# Patient Record
Sex: Female | Born: 1975 | Race: Black or African American | Hispanic: No | Marital: Single | Smoking: Former smoker
Health system: Southern US, Community
[De-identification: ages and names within clinical notes are randomized; demographics above are authoritative.]

## PROBLEM LIST (undated history)

## (undated) ENCOUNTER — Inpatient Hospital Stay (HOSPITAL_COMMUNITY): Payer: Self-pay

## (undated) DIAGNOSIS — N730 Acute parametritis and pelvic cellulitis: Secondary | ICD-10-CM

## (undated) DIAGNOSIS — A549 Gonococcal infection, unspecified: Secondary | ICD-10-CM

## (undated) DIAGNOSIS — F32A Depression, unspecified: Secondary | ICD-10-CM

## (undated) DIAGNOSIS — F329 Major depressive disorder, single episode, unspecified: Secondary | ICD-10-CM

## (undated) HISTORY — PX: BREAST SURGERY: SHX581

## (undated) HISTORY — PX: INDUCED ABORTION: SHX677

---

## 2004-09-20 ENCOUNTER — Emergency Department (HOSPITAL_COMMUNITY): Admission: EM | Admit: 2004-09-20 | Discharge: 2004-09-20 | Payer: Self-pay | Admitting: Emergency Medicine

## 2004-11-21 ENCOUNTER — Emergency Department (HOSPITAL_COMMUNITY): Admission: EM | Admit: 2004-11-21 | Discharge: 2004-11-21 | Payer: Self-pay | Admitting: Emergency Medicine

## 2005-02-19 ENCOUNTER — Inpatient Hospital Stay (HOSPITAL_COMMUNITY): Admission: AD | Admit: 2005-02-19 | Discharge: 2005-02-19 | Payer: Self-pay | Admitting: *Deleted

## 2005-06-10 ENCOUNTER — Inpatient Hospital Stay (HOSPITAL_COMMUNITY): Admission: AD | Admit: 2005-06-10 | Discharge: 2005-06-10 | Payer: Self-pay | Admitting: Obstetrics & Gynecology

## 2005-06-14 ENCOUNTER — Inpatient Hospital Stay (HOSPITAL_COMMUNITY): Admission: AD | Admit: 2005-06-14 | Discharge: 2005-06-14 | Payer: Self-pay | Admitting: *Deleted

## 2005-09-08 ENCOUNTER — Inpatient Hospital Stay (HOSPITAL_COMMUNITY): Admission: AD | Admit: 2005-09-08 | Discharge: 2005-09-08 | Payer: Self-pay | Admitting: Obstetrics

## 2005-11-05 ENCOUNTER — Inpatient Hospital Stay (HOSPITAL_COMMUNITY): Admission: AD | Admit: 2005-11-05 | Discharge: 2005-11-05 | Payer: Self-pay | Admitting: Obstetrics

## 2005-11-26 ENCOUNTER — Inpatient Hospital Stay (HOSPITAL_COMMUNITY): Admission: AD | Admit: 2005-11-26 | Discharge: 2005-11-26 | Payer: Self-pay | Admitting: Obstetrics

## 2005-12-21 ENCOUNTER — Inpatient Hospital Stay (HOSPITAL_COMMUNITY): Admission: AD | Admit: 2005-12-21 | Discharge: 2005-12-21 | Payer: Self-pay | Admitting: Obstetrics

## 2006-01-02 ENCOUNTER — Inpatient Hospital Stay (HOSPITAL_COMMUNITY): Admission: AD | Admit: 2006-01-02 | Discharge: 2006-01-05 | Payer: Self-pay | Admitting: Obstetrics

## 2006-01-26 ENCOUNTER — Inpatient Hospital Stay (HOSPITAL_COMMUNITY): Admission: AD | Admit: 2006-01-26 | Discharge: 2006-01-26 | Payer: Self-pay | Admitting: Obstetrics

## 2006-03-04 ENCOUNTER — Inpatient Hospital Stay (HOSPITAL_COMMUNITY): Admission: AD | Admit: 2006-03-04 | Discharge: 2006-03-04 | Payer: Self-pay | Admitting: Obstetrics

## 2007-03-13 ENCOUNTER — Emergency Department (HOSPITAL_COMMUNITY): Admission: EM | Admit: 2007-03-13 | Discharge: 2007-03-13 | Payer: Self-pay | Admitting: Emergency Medicine

## 2007-06-12 ENCOUNTER — Inpatient Hospital Stay (HOSPITAL_COMMUNITY): Admission: AD | Admit: 2007-06-12 | Discharge: 2007-06-12 | Payer: Self-pay | Admitting: Gynecology

## 2007-11-10 ENCOUNTER — Inpatient Hospital Stay (HOSPITAL_COMMUNITY): Admission: AD | Admit: 2007-11-10 | Discharge: 2007-11-10 | Payer: Self-pay | Admitting: Obstetrics

## 2007-11-24 ENCOUNTER — Inpatient Hospital Stay (HOSPITAL_COMMUNITY): Admission: AD | Admit: 2007-11-24 | Discharge: 2007-11-24 | Payer: Self-pay | Admitting: Obstetrics

## 2007-12-09 ENCOUNTER — Inpatient Hospital Stay (HOSPITAL_COMMUNITY): Admission: AD | Admit: 2007-12-09 | Discharge: 2007-12-13 | Payer: Self-pay | Admitting: Obstetrics

## 2008-01-09 ENCOUNTER — Emergency Department (HOSPITAL_COMMUNITY): Admission: EM | Admit: 2008-01-09 | Discharge: 2008-01-09 | Payer: Self-pay | Admitting: Emergency Medicine

## 2008-04-12 ENCOUNTER — Emergency Department (HOSPITAL_COMMUNITY): Admission: EM | Admit: 2008-04-12 | Discharge: 2008-04-12 | Payer: Self-pay | Admitting: Emergency Medicine

## 2008-04-14 ENCOUNTER — Emergency Department (HOSPITAL_COMMUNITY): Admission: EM | Admit: 2008-04-14 | Discharge: 2008-04-14 | Payer: Self-pay | Admitting: Emergency Medicine

## 2008-04-15 ENCOUNTER — Emergency Department (HOSPITAL_COMMUNITY): Admission: EM | Admit: 2008-04-15 | Discharge: 2008-04-15 | Payer: Self-pay | Admitting: Emergency Medicine

## 2008-05-18 ENCOUNTER — Emergency Department (HOSPITAL_COMMUNITY): Admission: EM | Admit: 2008-05-18 | Discharge: 2008-05-19 | Payer: Self-pay | Admitting: Emergency Medicine

## 2008-05-24 ENCOUNTER — Emergency Department (HOSPITAL_COMMUNITY): Admission: EM | Admit: 2008-05-24 | Discharge: 2008-05-24 | Payer: Self-pay | Admitting: Family Medicine

## 2008-06-29 ENCOUNTER — Emergency Department (HOSPITAL_COMMUNITY): Admission: EM | Admit: 2008-06-29 | Discharge: 2008-06-29 | Payer: Self-pay | Admitting: Emergency Medicine

## 2008-09-05 ENCOUNTER — Emergency Department (HOSPITAL_COMMUNITY): Admission: EM | Admit: 2008-09-05 | Discharge: 2008-09-05 | Payer: Self-pay | Admitting: Emergency Medicine

## 2008-10-10 ENCOUNTER — Emergency Department (HOSPITAL_COMMUNITY): Admission: EM | Admit: 2008-10-10 | Discharge: 2008-10-10 | Payer: Self-pay | Admitting: Emergency Medicine

## 2008-11-28 ENCOUNTER — Emergency Department (HOSPITAL_COMMUNITY): Admission: EM | Admit: 2008-11-28 | Discharge: 2008-11-28 | Payer: Self-pay | Admitting: Emergency Medicine

## 2008-11-29 ENCOUNTER — Emergency Department (HOSPITAL_COMMUNITY): Admission: EM | Admit: 2008-11-29 | Discharge: 2008-11-29 | Payer: Self-pay | Admitting: Emergency Medicine

## 2008-12-16 ENCOUNTER — Encounter: Admission: RE | Admit: 2008-12-16 | Discharge: 2008-12-16 | Payer: Self-pay | Admitting: Obstetrics

## 2009-05-02 ENCOUNTER — Inpatient Hospital Stay (HOSPITAL_COMMUNITY): Admission: AD | Admit: 2009-05-02 | Discharge: 2009-05-02 | Payer: Self-pay | Admitting: Obstetrics & Gynecology

## 2010-08-31 ENCOUNTER — Inpatient Hospital Stay (HOSPITAL_COMMUNITY): Admission: EM | Admit: 2010-08-31 | Discharge: 2010-09-03 | Payer: Self-pay | Admitting: Emergency Medicine

## 2010-12-16 ENCOUNTER — Encounter: Payer: Self-pay | Admitting: Obstetrics

## 2011-02-07 LAB — CBC
MCH: 28.4 pg (ref 26.0–34.0)
MCHC: 33.2 g/dL (ref 30.0–36.0)
Platelets: 362 10*3/uL (ref 150–400)

## 2011-02-07 LAB — POCT I-STAT, CHEM 8
Calcium, Ion: 1.14 mmol/L (ref 1.12–1.32)
Creatinine, Ser: 0.8 mg/dL (ref 0.4–1.2)
Glucose, Bld: 103 mg/dL — ABNORMAL HIGH (ref 70–99)
Hemoglobin: 13.3 g/dL (ref 12.0–15.0)
Sodium: 138 mEq/L (ref 135–145)

## 2011-02-07 LAB — CULTURE, ROUTINE-ABSCESS

## 2011-02-07 LAB — DIFFERENTIAL
Basophils Absolute: 0 10*3/uL (ref 0.0–0.1)
Basophils Relative: 0 % (ref 0–1)
Lymphs Abs: 4.3 10*3/uL — ABNORMAL HIGH (ref 0.7–4.0)
Neutrophils Relative %: 66 % (ref 43–77)

## 2011-02-07 LAB — ANAEROBIC CULTURE

## 2011-03-04 LAB — URINE MICROSCOPIC-ADD ON

## 2011-03-04 LAB — URINALYSIS, ROUTINE W REFLEX MICROSCOPIC
Leukocytes, UA: NEGATIVE
Protein, ur: NEGATIVE mg/dL
Specific Gravity, Urine: >1.03 — ABNORMAL HIGH (ref 1.005–1.030)
Urobilinogen, UA: 1 mg/dL (ref 0.0–1.0)

## 2011-04-09 NOTE — Op Note (Signed)
NAMETIAN, DAVISON                ACCOUNT NO.:  [id number]   MEDICAL RECORD NO.:  [id number]          PATIENT TYPE:  INP   LOCATION:  9135                          FACILITY:  [facility]   PHYSICIAN:  Kathreen Cosier, M.D.DATE OF BIRTH:  1976/07/27   DATE OF PROCEDURE:  12/10/2007  DATE OF DISCHARGE:                               OPERATIVE REPORT   PREOPERATIVE DIAGNOSIS:  Previous cesarean section at term with ruptured  membranes.   POSTOPERATIVE DIAGNOSIS:  Previous cesarean section at term with  ruptured membranes.   SURGEON:  Dr. Gaynell Face   PROCEDURE:  Repeat low transverse cesarean section.   FIRST ASSISTANT:  Dr. Tamela Oddi   ANESTHESIA:  Spinal.   PROCEDURE:  The patient was placed on the operating table in the supine  position after the spinal administered.  Abdomen prepped and draped,  __________  Foley catheter.  Transverse suprapubic incision was made  through the old scar and carried down to the rectus fascia.  The fascia  __________ the length of the incision.  The recti muscle was retracted  laterally.  Peritoneum incised longitudinally.  A transverse incision  made in the visceral peritoneum above the bladder.  Bladder mobilized  inferiorly.  The uterine wall was very thin and fluid was clear.  She  was delivered from the LOA position of a female.  Apgar 9 and 9 weighing 6  pounds 13 ounces.  The team was in attendance.  The placenta was  anterior and removed manually.  Anterior fundal removed manually.  Uterine cavity cleaned with dry laps.  Uterine incision closed in one  layer with continuous suture of #1 chromic.  Hemostasis was  satisfactory.  Bladder flap repaired with with 2-0 chromic.  The uterus  was contracted.  Tubes and ovaries normal.  Abdomen closed in layers.  The peritoneum continuous suture of chromic fascia, continuous suture of  0 Dexon, and the skin closed with subcuticular stitch of 4-0 Monocryl.  Blood loss 600 mL.     ______________________________  Kathreen Cosier, M.D.     BAM/MEDQ  D:  12/10/2007  T:  12/10/2007  Job:  [id number]

## 2011-04-09 NOTE — Discharge Summary (Signed)
NAMECARYLON, [name]                ACCOUNT NO.:  [id number]   MEDICAL RECORD NO.:  [id number]          PATIENT TYPE:  INP   LOCATION:  9135                          [facility]  [facility]   PHYSICIAN:  Kathreen Cosier, M.D.DATE OF BIRTH:  [birth year]   DATE OF ADMISSION:  12/09/2007  DATE OF DISCHARGE:  12/13/2007                               DISCHARGE SUMMARY   HISTORY:  Patient is a 35 year old gravida 4, para 2-0-1-2, [facility] December 21, 2007, had 2 previous  C. sections.  She was admitted with ruptured membranes at term and she  had a repeat low transverse cesarean section.  She had a female, Apgar 9  and 9, weighing 6 pounds 13 ounces.  Postoperatively, she did well.  On  admission, her hemoglobin was 11.6, postop 10.7.  White count 15.3,  11.6, RPR negative, HIV negative.  She was discharged home on the third  postoperative day ambulatory on a regular diet to see me in 6 weeks.   DISCHARGE DIAGNOSIS:  Status post repeat low transverse cesarean section  at term.           ______________________________  Kathreen Cosier, M.D.     BAM/MEDQ  D:  12/30/2007  T:  12/30/2007  Job:  [id number]

## 2011-04-10 ENCOUNTER — Inpatient Hospital Stay (HOSPITAL_COMMUNITY): Payer: Medicaid Other

## 2011-04-10 ENCOUNTER — Encounter (HOSPITAL_COMMUNITY): Payer: Self-pay | Admitting: Radiology

## 2011-04-10 ENCOUNTER — Inpatient Hospital Stay (HOSPITAL_COMMUNITY)
Admission: AD | Admit: 2011-04-10 | Discharge: 2011-04-10 | Disposition: A | Discharge status: Home / Self Care | Payer: Medicaid Other | Source: Ambulatory Visit | Attending: Family Medicine | Admitting: Family Medicine

## 2011-04-10 DIAGNOSIS — M549 Dorsalgia, unspecified: Secondary | ICD-10-CM | POA: Insufficient documentation

## 2011-04-10 DIAGNOSIS — O209 Hemorrhage in early pregnancy, unspecified: Secondary | ICD-10-CM

## 2011-04-10 LAB — CBC
MCHC: 33.4 g/dL (ref 30.0–36.0)
MCV: 84.2 fL (ref 78.0–100.0)
RBC: 4.44 MIL/uL (ref 3.87–5.11)
WBC: 22.6 10*3/uL — ABNORMAL HIGH (ref 4.0–10.5)

## 2011-04-10 LAB — URINALYSIS, ROUTINE W REFLEX MICROSCOPIC
Glucose, UA: NEGATIVE mg/dL
Ketones, ur: 15 mg/dL — AB
Protein, ur: 30 mg/dL — AB
Specific Gravity, Urine: >1.03 — ABNORMAL HIGH (ref 1.005–1.030)

## 2011-04-10 LAB — URINE MICROSCOPIC-ADD ON

## 2011-04-10 LAB — WET PREP, GENITAL: Trich, Wet Prep: NONE SEEN

## 2011-04-11 LAB — URINE CULTURE: Colony Count: >=100000

## 2011-04-11 LAB — GC/CHLAMYDIA PROBE AMP, GENITAL: Chlamydia, DNA Probe: NEGATIVE

## 2011-04-12 ENCOUNTER — Inpatient Hospital Stay (HOSPITAL_COMMUNITY): Payer: Medicaid Other

## 2011-04-12 ENCOUNTER — Inpatient Hospital Stay (HOSPITAL_COMMUNITY)
Admission: AD | Admit: 2011-04-12 | Discharge: 2011-04-12 | Disposition: A | Discharge status: Home / Self Care | Payer: Medicaid Other | Source: Ambulatory Visit | Attending: Family Medicine | Admitting: Family Medicine

## 2011-04-12 DIAGNOSIS — O200 Threatened abortion: Secondary | ICD-10-CM | POA: Insufficient documentation

## 2011-04-12 DIAGNOSIS — R52 Pain, unspecified: Secondary | ICD-10-CM

## 2011-04-12 DIAGNOSIS — R58 Hemorrhage, not elsewhere classified: Secondary | ICD-10-CM

## 2011-04-12 LAB — CBC
HCT: 36.2 % (ref 36.0–46.0)
Hemoglobin: 12 g/dL (ref 12.0–15.0)
MCH: 28 pg (ref 26.0–34.0)
MCHC: 33.1 g/dL (ref 30.0–36.0)

## 2011-04-12 NOTE — Op Note (Signed)
[name], [name]                ACCOUNT NO.:  [id number]   MEDICAL RECORD NO.:  [id number]          PATIENT TYPE:  INP   LOCATION:  9107                          [facility]   PHYSICIAN:  Kathreen Cosier, M.D.DATE OF BIRTH:  1976/01/26   DATE OF PROCEDURE:  01/02/2006  DATE OF DISCHARGE:                                 OPERATIVE REPORT   PREOPERATIVE DIAGNOSIS:  Previous cesarean section at 41 weeks.  The patient  desires repeat cesarean section at this point.   SURGEON:  Kathreen Cosier, M.D.   ANESTHESIA:  Epidural.   DESCRIPTION OF PROCEDURE:  The patient was placed on the operating table in  the supine position.  Abdomen prepped and draped. Bladder emptied with Foley  catheter.  Transverse suprapubic incision made through the old scar,  carried down through the rectus fascia.  The fascia was cleaned and incised  the length of the incision.  Rectus muscle were retracted laterally.  The  peritoneum was incised longitudinally.  Transverse incision made in the  lower aspect of the uterus.  Fluid was clear.  The patient was delivered  from the LOA position of a female, Apgars 8 and 9, weight 6 pounds 13 ounces.  The team was in attendance.  Placenta was fundal  and removed manually.  Uterine cavity was cleaned with dry laps.  Uterine incision closed in one  layer with continuous suture of #1 chromic.  Hemostasis was satisfactory.  Tubes and ovaries normal.  Abdomen closed in layers.  Peritoneum with  continuous suture of 0 chromic, fascia with continuous suture of 0 Dexon and  skin closed with subcuticular stitch of 3-0 Monocryl.  Blood loss 1200 mL.           ______________________________  Kathreen Cosier, M.D.     BAM/MEDQ  D:  01/02/2006  T:  01/03/2006  Job:  [id number]

## 2011-04-12 NOTE — Discharge Summary (Signed)
NAMECORLISS, COGGESHALL                ACCOUNT NO.:  [id number]   MEDICAL RECORD NO.:  [id number]          PATIENT TYPE:  INP   LOCATION:  9107                          [facility]  [facility]   PHYSICIAN:  Kathreen Cosier, M.D.DATE OF BIRTH:  Mar 08, 1976   DATE OF ADMISSION:  01/02/2006  DATE OF DISCHARGE:  01/05/2006                                 DISCHARGE SUMMARY   Patient is a 35 year old gravida 3, para 1-0-1-1, [facility] December 26, 2005.  She  had a previous cesarean section.  The cervix was 1 cm, 80%, vertex, -2/-3.  Membranes ruptured artificially.  Fluid clear.  IUPC inserted and she was  started on low dose Pitocin.  The patient after being in labor all day with  no significant progress demanded a repeat cesarean section which was granted  and she had a repeat low transverse cesarean section.  She had a female, Apgar  8/9 weighing 6 pounds 13 ounces.  On admission hemoglobin 12.8,  postoperative 12.1.  White count 14.9, 14.8.  The patient was noted to have  a left breast cyst which she has had drained twice in the past and this was  treated.  She was treated with antibiotics and warm soaks to the breast.  The abscess started draining foul smelling fluid on the third postoperative  day and patient is to be followed up at the breast center week after  discharge.   DISCHARGE DIAGNOSES:  Status post repeat low transverse cesarean section at  term and draining left breast abscess.           ______________________________  Kathreen Cosier, M.D.     BAM/MEDQ  D:  01/22/2006  T:  01/22/2006  Job:  [id number]

## 2011-04-12 NOTE — H&P (Signed)
[name], [name]                ACCOUNT NO.:  [id number]   MEDICAL RECORD NO.:  [id number]          PATIENT TYPE:  INP   LOCATION:  9107                          [facility]   PHYSICIAN:  Kathreen Cosier, M.D.DATE OF BIRTH:  June 09, 1976   DATE OF ADMISSION:  01/02/2006  DATE OF DISCHARGE:                                HISTORY & PHYSICAL   PREOPERATIVE DIAGNOSIS:  Previous cesarean section at 41 weeks, brought in  for induction and the patient then demanded a C-section.   HISTORY OF PRESENT ILLNESS:  The patient is a 35 year old gravida 3, para 1-  0-1-1, [facility] December 26, 2005.  Previous C-section at term, failure to  progress.  On admission cervix 1 cm, 80% vertex, -3.  Membranes are  ruptured, fluid is clear.  __________started on low dose Pitocin at 7 a.m.  on February 8.  Estimated fetal weight 7 pounds 3 ounces.  By 4:30 p.m. the  cervix was 2 cm, vertex, -3.  She had been in adequate labor all day.  The  patient stated that she did not want the labor anymore.  She wanted a repeat  C-section.  Her wishes were granted.   PHYSICAL EXAMINATION:  GENERAL:  Revealed a well-developed female in labor.  HEENT:  Negative.  LUNGS:  Clear.  HEART:  Regular rhythm.  No murmurs, no gallops.  BREASTS:  No masses.  ABDOMEN:  Term size uterus.  Estimated fetal weight 7 pounds 3 ounces.  PELVIC:  As described above.  EXTREMITIES:  Negative.           ______________________________  Kathreen Cosier, M.D.     BAM/MEDQ  D:  01/02/2006  T:  01/02/2006  Job:  [id number]

## 2011-04-13 LAB — RH IMMUNE GLOBULIN WORKUP (NOT WOMEN'S HOSP): Unit division: 0

## 2011-05-16 ENCOUNTER — Inpatient Hospital Stay (HOSPITAL_COMMUNITY): Admission: AD | Admit: 2011-05-16 | Payer: Self-pay | Source: Ambulatory Visit | Admitting: Obstetrics

## 2011-05-16 ENCOUNTER — Inpatient Hospital Stay (HOSPITAL_COMMUNITY): Payer: Medicaid Other

## 2011-05-16 ENCOUNTER — Inpatient Hospital Stay (HOSPITAL_COMMUNITY)
Admission: AD | Admit: 2011-05-16 | Discharge: 2011-05-16 | Disposition: A | Discharge status: Home / Self Care | Payer: Medicaid Other | Source: Ambulatory Visit | Attending: Obstetrics | Admitting: Obstetrics

## 2011-05-16 DIAGNOSIS — O021 Missed abortion: Secondary | ICD-10-CM | POA: Insufficient documentation

## 2011-05-16 LAB — URINALYSIS, ROUTINE W REFLEX MICROSCOPIC
Bilirubin Urine: NEGATIVE
Ketones, ur: NEGATIVE mg/dL
Nitrite: NEGATIVE
Urobilinogen, UA: 1 mg/dL (ref 0.0–1.0)

## 2011-05-16 LAB — WET PREP, GENITAL
Trich, Wet Prep: NONE SEEN
Yeast Wet Prep HPF POC: NONE SEEN

## 2011-05-17 ENCOUNTER — Inpatient Hospital Stay (HOSPITAL_COMMUNITY)
Admission: EM | Admit: 2011-05-17 | Discharge: 2011-05-18 | DRG: 779 | Disposition: A | Discharge status: Home / Self Care | Payer: Medicaid Other | Source: Ambulatory Visit | Attending: Obstetrics | Admitting: Obstetrics

## 2011-05-17 ENCOUNTER — Encounter (HOSPITAL_COMMUNITY): Payer: Self-pay | Admitting: *Deleted

## 2011-05-17 ENCOUNTER — Other Ambulatory Visit: Payer: Self-pay | Admitting: Obstetrics

## 2011-05-17 ENCOUNTER — Inpatient Hospital Stay (HOSPITAL_COMMUNITY): Admission: EM | Admit: 2011-05-17 | Payer: Medicaid Other | Source: Ambulatory Visit | Admitting: Obstetrics

## 2011-05-17 DIAGNOSIS — O021 Missed abortion: Principal | ICD-10-CM | POA: Diagnosis present

## 2011-05-17 LAB — CBC
Hemoglobin: 12 g/dL (ref 12.0–15.0)
MCH: 29.1 pg (ref 26.0–34.0)
MCHC: 33.9 g/dL (ref 30.0–36.0)
MCV: 85.7 fL (ref 78.0–100.0)
Platelets: 401 10*3/uL — ABNORMAL HIGH (ref 150–400)

## 2011-05-17 LAB — GC/CHLAMYDIA PROBE AMP, GENITAL: GC Probe Amp, Genital: NEGATIVE

## 2011-05-17 LAB — RPR: RPR Ser Ql: NONREACTIVE

## 2011-05-18 LAB — CBC
HCT: 28.3 % — ABNORMAL LOW (ref 36.0–46.0)
RDW: 15 % (ref 11.5–15.5)
WBC: 15.8 10*3/uL — ABNORMAL HIGH (ref 4.0–10.5)

## 2011-05-18 LAB — RH IMMUNE GLOBULIN WORKUP (NOT WOMEN'S HOSP)

## 2011-05-22 NOTE — Discharge Summary (Signed)
  [name], [name]                ACCOUNT NO.:  [id number]  MEDICAL RECORD NO.:  [id number]  LOCATION:  9305                          [facility]  [facility]  PHYSICIAN:  Kathreen Cosier, M.D.DATE OF BIRTH:  [birth year]  DATE OF ADMISSION:  05/17/2011 DATE OF DISCHARGE:                              DISCHARGE SUMMARY   HISTORY OF PRESENT ILLNESS:  The patient is a 35 year old gravida 5, para 3, had three C-sections in the past who came to the hospital was May 16, 2011, with some amount of vaginal bleeding.  Ultrasound showed she had 16-week fetal demise and laminaria was placed in her cervix, since the patient did not want to be admitted at that time and then she was admitted to the hospital on May 17, 2011.  The laminaria was removed and 600 mcg of Cytotec were placed high up in the vagina at 3:50 p.m., she passed a macerated fetus, and this placenta was still intact so 800 mcg of Cytotec was placed per rectum and at 7:15 p.m., the placenta was removed from the vagina.  She did well post delivery. She was known to have a Crawford antigen and received RhoGAM.  She was discharged home on postpartum day #1 on Percocet to 7.5/325 and Loestrin for contraception to see me in one month.  DISCHARGE DIAGNOSES:  Status post second trimester intrauterine fetal demise.          ______________________________ Kathreen Cosier, M.D.     BAM/MEDQ  D:  05/18/2011  T:  05/18/2011  Job:  [id number]  Electronically Signed by Francoise Ceo M.D. on 05/22/2011 09:18:35 AM

## 2011-05-26 DEATH — deceased

## 2011-06-15 ENCOUNTER — Inpatient Hospital Stay (HOSPITAL_COMMUNITY): Admission: AD | Admit: 2011-06-15 | Payer: Self-pay | Source: Ambulatory Visit | Admitting: Obstetrics

## 2011-08-14 LAB — CBC
Hemoglobin: 11.6 — ABNORMAL LOW
MCHC: 35
RBC: 4.13
WBC: 15.3 — ABNORMAL HIGH

## 2011-08-14 LAB — RPR: RPR Ser Ql: NONREACTIVE

## 2011-08-15 LAB — CBC
HCT: 31.6 — ABNORMAL LOW
Hemoglobin: 10.7 — ABNORMAL LOW
MCV: 82.1
Platelets: 249
RBC: 3.85 — ABNORMAL LOW
WBC: 11.6 — ABNORMAL HIGH

## 2011-08-15 LAB — RH IMMUNE GLOB WKUP(>/=20WKS)(NOT WOMEN'S HOSP): Fetal Screen: POSITIVE

## 2011-08-15 LAB — KLEIHAUER-BETKE STAIN: # Vials RhIg: 1

## 2011-08-30 LAB — RH IMMUNE GLOBULIN WORKUP (NOT WOMEN'S HOSP)

## 2011-09-09 LAB — CULTURE, ROUTINE-ABSCESS

## 2011-09-09 LAB — POCT PREGNANCY, URINE: Operator id: 133441

## 2011-10-23 NOTE — H&P (Signed)
  NAMENAWAL, BURLING                ACCOUNT NO.:  [id number]  MEDICAL RECORD NO.:  [id number]  LOCATION:  [facility]                          [facility]ILITY:  [facility]  PHYSICIAN:  Kathreen Cosier, M.D.DATE OF BIRTH:  12/21/1975  DATE OF ADMISSION:  06/15/2011 DATE OF DISCHARGE:                             HISTORY & PHYSICAL   The patient is a 35 year old gravida 5, para 3 patient of mine who was not seen during this pregnancy __________ up in the emergency room complaining of bleeding.  She is supposed to be [redacted] weeks pregnant and ultrasound done showed an intrauterine fetal demise at 16 weeks.  The patient also had laminaria placed in her cervix and was sent home from the emergency room to return on May 22.  At that time, the laminaria was removed and 600 mcg of Cytotec was inserted in the vagina.  The patient was known to have Crawford antigen and received RhoGAM.  At 3:50 p.m. on June 22, she passed the macerated fetus and 800 mcg of Cytotec was placed per rectum and at 7:15 p.m. she passed the placenta spontaneously.  PHYSICAL EXAMINATION:  GENERAL:  A well-developed female, in no distress. HEENT:  Negative. LUNGS:  Clear. HEART:  Regular rhythm.  No murmurs.  No gallops. BREASTS:  No masses. ABDOMEN:  16-18 weeks size. EXTREMITIES:  Negative. PELVIC:  As described above.          ______________________________ Kathreen Cosier, M.D.     BAM/MEDQ  D:  05/29/2011  T:  05/29/2011  Job:  [id number]  Electronically Signed by Francoise Ceo M.D. on 10/23/2011 08:24:06 AM

## 2012-01-09 ENCOUNTER — Encounter (HOSPITAL_COMMUNITY): Payer: Self-pay | Admitting: *Deleted

## 2012-04-15 ENCOUNTER — Emergency Department (HOSPITAL_COMMUNITY)
Admission: EM | Admit: 2012-04-15 | Discharge: 2012-04-15 | Disposition: A | Discharge status: Home / Self Care | Payer: Self-pay | Attending: Emergency Medicine | Admitting: Emergency Medicine

## 2012-04-15 ENCOUNTER — Emergency Department (HOSPITAL_COMMUNITY): Payer: Self-pay

## 2012-04-15 ENCOUNTER — Encounter (HOSPITAL_COMMUNITY): Payer: Self-pay | Admitting: *Deleted

## 2012-04-15 DIAGNOSIS — R059 Cough, unspecified: Secondary | ICD-10-CM | POA: Insufficient documentation

## 2012-04-15 DIAGNOSIS — J069 Acute upper respiratory infection, unspecified: Secondary | ICD-10-CM | POA: Insufficient documentation

## 2012-04-15 DIAGNOSIS — R5383 Other fatigue: Secondary | ICD-10-CM | POA: Insufficient documentation

## 2012-04-15 DIAGNOSIS — M549 Dorsalgia, unspecified: Secondary | ICD-10-CM | POA: Insufficient documentation

## 2012-04-15 DIAGNOSIS — R079 Chest pain, unspecified: Secondary | ICD-10-CM | POA: Insufficient documentation

## 2012-04-15 DIAGNOSIS — R05 Cough: Secondary | ICD-10-CM | POA: Insufficient documentation

## 2012-04-15 DIAGNOSIS — R0989 Other specified symptoms and signs involving the circulatory and respiratory systems: Secondary | ICD-10-CM | POA: Insufficient documentation

## 2012-04-15 DIAGNOSIS — R5381 Other malaise: Secondary | ICD-10-CM | POA: Insufficient documentation

## 2012-04-15 DIAGNOSIS — R6883 Chills (without fever): Secondary | ICD-10-CM | POA: Insufficient documentation

## 2012-04-15 DIAGNOSIS — R1110 Vomiting, unspecified: Secondary | ICD-10-CM | POA: Insufficient documentation

## 2012-04-15 MED ORDER — AZITHROMYCIN 250 MG PO TABS: 250.0000 mg | ORAL_TABLET | Freq: Every day | ORAL | Status: AC

## 2012-04-15 MED ORDER — HYDROCOD POLST-CHLORPHEN POLST 10-8 MG/5ML PO LQCR
5.0000 mL | Freq: Once | ORAL | Status: AC
  Administered 2012-04-15: 5 mL via ORAL
  Filled 2012-04-15: qty 5

## 2012-04-15 MED ORDER — HYDROCOD POLST-CHLORPHEN POLST 10-8 MG/5ML PO LQCR: 5.0000 mL | Freq: Two times a day (BID) | ORAL | Status: DC | PRN

## 2012-04-15 MED ORDER — SODIUM CHLORIDE 0.9 % IV BOLUS (SEPSIS)
1000.0000 mL | Freq: Once | INTRAVENOUS | Status: AC
  Administered 2012-04-15: 1000 mL via INTRAVENOUS

## 2012-04-15 NOTE — ED Notes (Signed)
Patient reports she has been sick for a month.  She has a cough, back pain, n/v, and coughing up green colored phlegm.  She denies fever at present.  She states she is feeling weak and had to have someone drive her here today

## 2012-04-15 NOTE — ED Notes (Signed)
Family at bedside. 

## 2012-04-15 NOTE — ED Provider Notes (Signed)
History     CSN: [id number]  Arrival date & time 04/15/12  1028   First MD Initiated Contact with Patient 04/15/12 1127      Chief Complaint  Patient presents with  . Cough  . Emesis  . Back Pain    (Consider location/radiation/quality/duration/timing/severity/associated sxs/prior treatment) Patient is a 36 y.o. female presenting with cough. The history is provided by the patient.  Cough This is a new problem. The current episode started more than 1 week ago. The problem occurs constantly. The problem has been gradually worsening. The cough is productive of sputum. There has been no fever. Associated symptoms include chest pain and chills. Pertinent negatives include no wheezing. Associated symptoms comments: Symptoms of chest congestion, cough, post-tussive vomiting, chills and now generalized weakness x one month. She denies sinus or nasal congestion, nausea, abdominal pain or diarrhea.. She is a smoker.    History reviewed. No pertinent past medical history.  Past Surgical History  Procedure Date  . Breast surgery   . Induced abortion     No family history on file.  History  Substance Use Topics  . Smoking status: Current Everyday Smoker  . Smokeless tobacco: Not on file  . Alcohol Use: Yes    OB History    Grav Para Term Preterm Abortions TAB SAB Ect Mult Living   1               Review of Systems  Constitutional: Positive for chills.  HENT: Negative for congestion.   Respiratory: Positive for cough. Negative for wheezing.   Cardiovascular: Positive for chest pain.  Gastrointestinal: Positive for vomiting. Negative for nausea and abdominal pain.       Vomiting is post-tussive only.  Musculoskeletal: Positive for back pain.  Skin: Negative for rash.  Neurological: Positive for weakness.    Allergies  Review of patient's allergies indicates no known allergies.  Home Medications   Current Outpatient Rx  Name Route Sig Dispense Refill  . IBUPROFEN 200  MG PO TABS Oral Take 200 mg by mouth every 6 (six) hours as needed. For pain      BP 109/62  Pulse 83  Temp(Src) 98.1 F (36.7 C) (Oral)  Resp 18  Ht 5\' 5"  (1.651 m)  Wt 201 lb 5 oz (91.315 kg)  BMI 33.50 kg/m2  SpO2 97%  LMP 04/15/2012  Physical Exam  Constitutional: She is oriented to person, place, and time. She appears well-developed and well-nourished.  HENT:  Head: Normocephalic.  Eyes: Pupils are equal, round, and reactive to light.  Neck: Normal range of motion. Neck supple.  Cardiovascular: Normal rate and regular rhythm.   No murmur heard. Pulmonary/Chest: Effort normal and breath sounds normal. She has no wheezes. She has no rales.  Abdominal: Soft. Bowel sounds are normal. There is no tenderness. There is no rebound and no guarding.  Musculoskeletal: Normal range of motion. She exhibits no edema.  Neurological: She is alert and oriented to person, place, and time.  Skin: Skin is warm and dry. No rash noted.  Psychiatric: She has a normal mood and affect.    ED Course  Procedures (including critical care time)  Labs Reviewed - No data to display Dg Chest 2 View  04/15/2012  *RADIOLOGY REPORT*  Clinical Data: Shortness of breath, cough and upper back pain.  CHEST - 2 VIEW  Comparison: None.  Findings: Trachea is midline.  Heart size normal.  Lungs are clear. No pleural fluid.  IMPRESSION: Negative.  Original  Report Authenticated By: Reyes Ivan, M.D.     No diagnosis found. 1. URI   MDM  Patient stable for discharge. Exam c/w URI and patient nontoxic in appearance.        Rodena Medin, PA-C 04/19/12 1033

## 2012-04-15 NOTE — ED Notes (Signed)
Patient is resting comfortably. 

## 2012-04-15 NOTE — Discharge Instructions (Signed)
YOU CAN BE DISCHARGED HOME. TAKE MEDICATIONS AS PRESCRIBED. PUSH FLUIDS. REST. RECOMMEND TYLENOL AND/OR IBUPROFEN FOR ANY ACHES AND IF ANY FEVER DEVELOPS. FOLLOW UP WITH YOUR DOCTOR FOR RECHECK IN 1-2 DAYS.  Upper Respiratory Infection, Adult An upper respiratory infection (URI) is also known as the common cold. It is often caused by a type of germ (virus). Colds are easily spread (contagious). You can pass it to others by kissing, coughing, sneezing, or drinking out of the same glass. Usually, you get better in 1 or 2 weeks.  HOME CARE   Only take medicine as told by your doctor.   Use a warm mist humidifier or breathe in steam from a hot shower.   Drink enough water and fluids to keep your pee (urine) clear or pale yellow.   Get plenty of rest.   Return to work when your temperature is back to normal or as told by your doctor. You may use a face mask and wash your hands to stop your cold from spreading.  GET HELP RIGHT AWAY IF:   After the first few days, you feel you are getting worse.   You have questions about your medicine.   You have chills, shortness of breath, or brown or red spit (mucus).   You have yellow or brown snot (nasal discharge) or pain in the face, especially when you bend forward.   You have a fever, puffy (swollen) neck, pain when you swallow, or white spots in the back of your throat.   You have a bad headache, ear pain, sinus pain, or chest pain.   You have a high-pitched whistling sound when you breathe in and out (wheezing).   You have a lasting cough or cough up blood.   You have sore muscles or a stiff neck.  MAKE SURE YOU:   Understand these instructions.   Will watch your condition.   Will get help right away if you are not doing well or get worse.  Document Released: 04/29/2008 Document Revised: 10/31/2011 Document Reviewed: 03/18/2011 [facility] Patient Information 2012 [place], [place].

## 2012-04-20 NOTE — ED Provider Notes (Signed)
Medical screening examination/treatment/procedure(s) were performed by non-physician practitioner and as supervising physician I was immediately available for consultation/collaboration.   Gavin Pound. Lanis Storlie, MD 04/20/12 1610

## 2012-10-24 ENCOUNTER — Emergency Department (HOSPITAL_COMMUNITY)
Admission: EM | Admit: 2012-10-24 | Discharge: 2012-10-24 | Disposition: A | Discharge status: Home / Self Care | Payer: Self-pay | Attending: Emergency Medicine | Admitting: Emergency Medicine

## 2012-10-24 ENCOUNTER — Encounter (HOSPITAL_COMMUNITY): Payer: Self-pay | Admitting: Emergency Medicine

## 2012-10-24 DIAGNOSIS — J3489 Other specified disorders of nose and nasal sinuses: Secondary | ICD-10-CM | POA: Insufficient documentation

## 2012-10-24 DIAGNOSIS — J069 Acute upper respiratory infection, unspecified: Secondary | ICD-10-CM | POA: Insufficient documentation

## 2012-10-24 DIAGNOSIS — R51 Headache: Secondary | ICD-10-CM | POA: Insufficient documentation

## 2012-10-24 DIAGNOSIS — R52 Pain, unspecified: Secondary | ICD-10-CM | POA: Insufficient documentation

## 2012-10-24 DIAGNOSIS — R079 Chest pain, unspecified: Secondary | ICD-10-CM | POA: Insufficient documentation

## 2012-10-24 DIAGNOSIS — F17200 Nicotine dependence, unspecified, uncomplicated: Secondary | ICD-10-CM | POA: Insufficient documentation

## 2012-10-24 DIAGNOSIS — B349 Viral infection, unspecified: Secondary | ICD-10-CM

## 2012-10-24 DIAGNOSIS — B9789 Other viral agents as the cause of diseases classified elsewhere: Secondary | ICD-10-CM | POA: Insufficient documentation

## 2012-10-24 LAB — RAPID STREP SCREEN (MED CTR MEBANE ONLY): Streptococcus, Group A Screen (Direct): NEGATIVE

## 2012-10-24 MED ORDER — IBUPROFEN 400 MG PO TABS
800.0000 mg | ORAL_TABLET | Freq: Once | ORAL | Status: AC
  Administered 2012-10-24: 800 mg via ORAL
  Filled 2012-10-24 (×2): qty 2

## 2012-10-24 MED ORDER — ALBUTEROL SULFATE HFA 108 (90 BASE) MCG/ACT IN AERS
2.0000 | INHALATION_SPRAY | RESPIRATORY_TRACT | Status: DC | PRN
  Administered 2012-10-24: 2 via RESPIRATORY_TRACT
  Filled 2012-10-24 (×2): qty 6.7

## 2012-10-24 NOTE — ED Provider Notes (Signed)
History   This chart was scribed for Elizabeth Chick, MD scribed by Magnus Sinning. The patient was seen in room TR05C/TR05C at 12:09    CSN: [id number]  Arrival date & time 10/24/12  1129   None     Chief Complaint  Patient presents with  . Sore Throat  . Nasal Congestion  . Generalized Body Aches    (Consider location/radiation/quality/duration/timing/severity/associated sxs/prior treatment) HPI Comments: [name] is a 36 y.o. female who presents to the Emergency Department complaining of constant moderate ST with associated generalized body aches, chest pain, cough, HA, and chills. The patient states her sxs began 3 days ago. She explains that she has been maintaining fluids, but sxs have not resolved.  PCP: None.  Patient is a 36 y.o. female presenting with pharyngitis. The history is provided by the patient and the spouse. No language interpreter was used.  Sore Throat This is a new problem. The current episode started more than 2 days ago. The problem occurs constantly. The problem has not changed since onset.Associated symptoms include chest pain and headaches. Nothing aggravates the symptoms. Nothing relieves the symptoms. She has tried water for the symptoms. The treatment provided no relief.    History reviewed. No pertinent past medical history.  Past Surgical History  Procedure Date  . Breast surgery   . Induced abortion     History reviewed. No pertinent family history.  History  Substance Use Topics  . Smoking status: Current Every Day Smoker  . Smokeless tobacco: Not on file  . Alcohol Use: Yes    OB History    Grav Para Term Preterm Abortions TAB SAB Ect Mult Living   1               Review of Systems  Cardiovascular: Positive for chest pain.  Neurological: Positive for headaches.  All other systems reviewed and are negative.    Allergies  Review of patient's allergies indicates no known allergies.  Home Medications   No current  outpatient prescriptions on file.  BP 124/74  Pulse 81  Temp 98.1 F (36.7 C)  Resp 16  SpO2 100%  Physical Exam  Nursing note and vitals reviewed. Constitutional: She is oriented to person, place, and time. She appears well-developed and well-nourished. No distress.  HENT:  Head: Normocephalic and atraumatic.       Oropharynx has moderate erythema. Pallete is symmetric and uvual is midline  Eyes: Conjunctivae normal and EOM are normal.  Neck: Neck supple. No tracheal deviation present.  Cardiovascular: Normal rate and regular rhythm.   Pulmonary/Chest: Effort normal. No respiratory distress. She has no wheezes. She has no rales.       Lungs clear  Abdominal: She exhibits no distension.  Musculoskeletal: Normal range of motion.  Neurological: She is alert and oriented to person, place, and time. No sensory deficit.  Skin: Skin is warm and dry. No rash noted.  Psychiatric: She has a normal mood and affect. Her behavior is normal.    ED Course  Procedures (including critical care time) DIAGNOSTIC STUDIES: Oxygen Saturation is 100% on room air, normal by my interpretation.    COORDINATION OF CARE:   Labs Reviewed  RAPID STREP SCREEN   No results found.   1. Viral infection   2. URI (upper respiratory infection)       MDM  Pt with subjective fever, sore throat, headache, bodyaches, hoarse voice and cough.  Rapid strep negative.  Lungs clear, vitals reassuring.  Doubt pneumonia.  More likely viral infection with some component of bronchitis. Pt given ibuprofen for body aches, etc.  Albuterol MDI for cough.  Discharged with strict return precautions.  Pt agreeable with plan.  I personally performed the services described in this documentation, which was scribed in my presence. The recorded information has been reviewed and is accurate.        Elizabeth Chick, MD 10/24/12 [phone]

## 2012-10-24 NOTE — ED Notes (Signed)
Pt c/o sore throat and fever with generalized body aches x 2 days; pt c/o sore throat and head congestion

## 2013-07-24 ENCOUNTER — Inpatient Hospital Stay (HOSPITAL_COMMUNITY)
Admission: AD | Admit: 2013-07-24 | Discharge: 2013-07-24 | Disposition: A | Discharge status: Home / Self Care | Payer: Self-pay | Source: Ambulatory Visit | Attending: Obstetrics and Gynecology | Admitting: Obstetrics and Gynecology

## 2013-07-24 ENCOUNTER — Encounter (HOSPITAL_COMMUNITY): Payer: Self-pay | Admitting: *Deleted

## 2013-07-24 DIAGNOSIS — M549 Dorsalgia, unspecified: Secondary | ICD-10-CM | POA: Insufficient documentation

## 2013-07-24 DIAGNOSIS — O210 Mild hyperemesis gravidarum: Secondary | ICD-10-CM

## 2013-07-24 DIAGNOSIS — O211 Hyperemesis gravidarum with metabolic disturbance: Secondary | ICD-10-CM | POA: Insufficient documentation

## 2013-07-24 DIAGNOSIS — O26899 Other specified pregnancy related conditions, unspecified trimester: Secondary | ICD-10-CM

## 2013-07-24 DIAGNOSIS — E860 Dehydration: Secondary | ICD-10-CM | POA: Insufficient documentation

## 2013-07-24 DIAGNOSIS — R109 Unspecified abdominal pain: Secondary | ICD-10-CM | POA: Insufficient documentation

## 2013-07-24 HISTORY — DX: Acute parametritis and pelvic cellulitis: N73.0

## 2013-07-24 HISTORY — DX: Depression, unspecified: F32.A

## 2013-07-24 HISTORY — DX: Major depressive disorder, single episode, unspecified: F32.9

## 2013-07-24 HISTORY — DX: Gonococcal infection, unspecified: A54.9

## 2013-07-24 LAB — URINALYSIS, ROUTINE W REFLEX MICROSCOPIC
Bilirubin Urine: NEGATIVE
Glucose, UA: NEGATIVE mg/dL
Ketones, ur: 15 mg/dL — AB
pH: 6 (ref 5.0–8.0)

## 2013-07-24 LAB — URINE MICROSCOPIC-ADD ON

## 2013-07-24 MED ORDER — PROMETHAZINE HCL 25 MG PO TABS: 25.0000 mg | ORAL_TABLET | Freq: Four times a day (QID) | ORAL | Status: DC | PRN

## 2013-07-24 MED ORDER — ACETAMINOPHEN 325 MG PO TABS
650.0000 mg | ORAL_TABLET | Freq: Once | ORAL | Status: AC
  Administered 2013-07-24: 650 mg via ORAL
  Filled 2013-07-24: qty 2

## 2013-07-24 MED ORDER — ONDANSETRON 8 MG PO TBDP
8.0000 mg | ORAL_TABLET | Freq: Once | ORAL | Status: AC
  Administered 2013-07-24: 8 mg via ORAL
  Filled 2013-07-24: qty 1

## 2013-07-24 NOTE — MAU Provider Note (Signed)
History     CSN: [id number]  Arrival date and time: 07/24/13 1014   First Provider Initiated Contact with Patient 07/24/13 1206      Chief Complaint  Patient presents with  . Abdominal Cramping  . Back Pain   HPI [name] 37 y.o. [redacted]w[redacted]d   Had a positive pregnancy test at home.  Has not started prenatal care.  Is having lower abdominal pain and back pain today.  Has not taken any pain medication.  Reports feeling weak, lack of appetite, nausea and occasional vomiting.  OB History   Grav Para Term Preterm Abortions TAB SAB Ect Mult Living   7 3 3  3 2 1   3       Past Medical History  Diagnosis Date  . Depression   . PID (acute pelvic inflammatory disease)   . Gonorrhea     Past Surgical History  Procedure Laterality Date  . Breast surgery    . Induced abortion    . Cesarean section      X 3    History reviewed. No pertinent family history.  History  Substance Use Topics  . Smoking status: Current Every Day Smoker -- 0.25 packs/day    Types: Cigarettes  . Smokeless tobacco: Not on file  . Alcohol Use: No    Allergies: No Known Allergies  No prescriptions prior to admission    Review of Systems  Constitutional: Negative for fever.  Gastrointestinal: Positive for nausea, vomiting and abdominal pain. Negative for diarrhea and constipation.  Genitourinary:       No vaginal discharge. No vaginal bleeding. No dysuria.   Physical Exam   Blood pressure 123/68, pulse 97, temperature 98.1 F (36.7 C), temperature source Oral, resp. rate 18, last menstrual period 03/24/2013.  Physical Exam  Nursing note and vitals reviewed. Constitutional: She is oriented to person, place, and time. She appears well-developed and well-nourished.  HENT:  Head: Normocephalic.  Eyes: EOM are normal.  Neck: Neck supple.  GI: Soft. There is tenderness. There is no rebound and no guarding.  Genitourinary:  Speculum exam: Vagina - Small amount of creamy discharge, no  odor Cervix - No contact bleeding Bimanual exam: Cervix closed Uterus non tender, 14 week size Adnexa non tender, no masses bilaterally GC/Chlam, wet prep done Chaperone present for exam.  Musculoskeletal: Normal range of motion.  Neurological: She is alert and oriented to person, place, and time.  Skin: Skin is warm and dry.  Psychiatric: She has a normal mood and affect.    MAU Course  Procedures  MDM Results for orders placed during the hospital encounter of 07/24/13 (from the past 24 hour(s))  URINALYSIS, ROUTINE W REFLEX MICROSCOPIC     Status: Abnormal   Collection Time    07/24/13 11:23 AM      Result Value Range   Color, Urine AMBER (*) YELLOW   APPearance HAZY (*) CLEAR   Specific Gravity, Urine >1.030 (*) 1.005 - 1.030   pH 6.0  5.0 - 8.0   Glucose, UA NEGATIVE  NEGATIVE mg/dL   Hgb urine dipstick NEGATIVE  NEGATIVE   Bilirubin Urine NEGATIVE  NEGATIVE   Ketones, ur 15 (*) NEGATIVE mg/dL   Protein, ur 272 (*) NEGATIVE mg/dL   Urobilinogen, UA 2.0 (*) 0.0 - 1.0 mg/dL   Nitrite NEGATIVE  NEGATIVE   Leukocytes, UA NEGATIVE  NEGATIVE  URINE MICROSCOPIC-ADD ON     Status: Abnormal   Collection Time    07/24/13 11:23 AM  Result Value Range   Squamous Epithelial / LPF MANY (*) RARE   WBC, UA 0-2  <3 WBC/hpf   RBC / HPF 0-2  <3 RBC/hpf   Bacteria, UA MANY (*) RARE   Urine-Other MUCOUS PRESENT    POCT PREGNANCY, URINE     Status: Abnormal   Collection Time    07/24/13 11:26 AM      Result Value Range   Preg Test, Ur POSITIVE (*) NEGATIVE  WET PREP, GENITAL     Status: Abnormal   Collection Time    07/24/13 12:12 PM      Result Value Range   Yeast Wet Prep HPF POC NONE SEEN  NONE SEEN   Trich, Wet Prep NONE SEEN  NONE SEEN   Clue Cells Wet Prep HPF POC MODERATE (*) NONE SEEN   WBC, Wet Prep HPF POC FEW (*) NONE SEEN   Given Tylenol and Zofran PO in MAU.  Encouraged to drink lots of fluids and discussed importance of protein at meals.  Encouraged to eat  every 3-4 hours even if no appetite. Client was feeling much better at time of discharge.  Assessment and Plan  Pregnancy 17 weeks by LMP Morning sickness and mild dehydration  Plan Pregnancy verification given No smoking, no drugs, no alcohol.  Take a prenatal vitamin one by mouth every day.  Eat small frequent snacks to avoid nausea.  Begin prenatal care as soon as possible. Plans to see Dr. Gaynell Face. Rx for Phenergan given  [name] 07/24/2013, 12:31 PM

## 2013-07-24 NOTE — MAU Provider Note (Signed)
Attestation of Attending Supervision of Advanced Practitioner (CNM/NP): Evaluation and management procedures were performed by the Advanced Practitioner under my supervision and collaboration.  I have reviewed the Advanced Practitioner's note and chart, and I agree with the management and plan.  Rossana Molchan 07/24/2013 4:34 PM   

## 2013-07-24 NOTE — MAU Note (Signed)
Abdominal cramping and back pain for over a month. Feels sharp pains in lower abdomen down into vaginal area. States urine keeps getting darker and darker. + UPT about 2 months ago.

## 2013-07-24 NOTE — MAU Note (Signed)
Patient not in lobby when called

## 2013-07-26 LAB — GC/CHLAMYDIA PROBE AMP: CT Probe RNA: NEGATIVE

## 2013-09-07 ENCOUNTER — Other Ambulatory Visit (HOSPITAL_COMMUNITY): Payer: Self-pay | Admitting: Obstetrics

## 2013-09-07 DIAGNOSIS — Z3689 Encounter for other specified antenatal screening: Secondary | ICD-10-CM

## 2013-09-07 DIAGNOSIS — O09522 Supervision of elderly multigravida, second trimester: Secondary | ICD-10-CM

## 2013-09-16 ENCOUNTER — Ambulatory Visit (HOSPITAL_COMMUNITY)
Admission: RE | Admit: 2013-09-16 | Discharge: 2013-09-16 | Disposition: A | Discharge status: Home / Self Care | Payer: Medicaid Other | Source: Ambulatory Visit | Attending: Obstetrics | Admitting: Obstetrics

## 2013-09-16 DIAGNOSIS — Z1389 Encounter for screening for other disorder: Secondary | ICD-10-CM | POA: Insufficient documentation

## 2013-09-16 DIAGNOSIS — Z363 Encounter for antenatal screening for malformations: Secondary | ICD-10-CM | POA: Insufficient documentation

## 2013-09-16 DIAGNOSIS — O09529 Supervision of elderly multigravida, unspecified trimester: Secondary | ICD-10-CM | POA: Insufficient documentation

## 2013-09-16 DIAGNOSIS — O09299 Supervision of pregnancy with other poor reproductive or obstetric history, unspecified trimester: Secondary | ICD-10-CM | POA: Insufficient documentation

## 2013-09-16 DIAGNOSIS — Z3689 Encounter for other specified antenatal screening: Secondary | ICD-10-CM

## 2013-09-16 DIAGNOSIS — O09522 Supervision of elderly multigravida, second trimester: Secondary | ICD-10-CM

## 2013-09-16 DIAGNOSIS — O358XX0 Maternal care for other (suspected) fetal abnormality and damage, not applicable or unspecified: Secondary | ICD-10-CM | POA: Insufficient documentation

## 2013-09-16 NOTE — Progress Notes (Signed)
Genetic Counseling  High-Risk Gestation Note  Appointment Date:  09/16/2013 Referred By: Kathreen Cosier, MD Date of Birth:  [birth year]    Pregnancy History: [id number] Estimated Date of Delivery: 12/29/13 Estimated Gestational Age: [redacted]w[redacted]d Attending: Particia Nearing, MD   [name] was seen for genetic counseling because of a maternal age of 37 y.o..     She was counseled regarding maternal age and the association with risk for chromosome conditions due to nondisjunction with aging of the ova.   We reviewed chromosomes, nondisjunction, and the associated 1 in 141 risk for fetal aneuploidy related to a maternal age of 37 y.o. at [redacted]w[redacted]d gestation.  She was/They were counseled that the risk for aneuploidy decreases as gestational age increases, accounting for those pregnancies which spontaneously abort.  We specifically discussed Down syndrome (trisomy 81), trisomies 42 and 22, and sex chromosome aneuploidies (47,XXX and 47,XXY) including the common features and prognoses of each.   We reviewed available screening options including noninvasive prenatal screening (NIPS)/cell free fetal DNA (cffDNA) testing, and detailed ultrasound.  She was counseled that screening tests are used to modify a patient's a priori risk for aneuploidy, typically based on age. This estimate provides a pregnancy specific risk assessment. We reviewed the benefits and limitations of each option. Specifically, we discussed the conditions for which each test screens, the detection rates, and false positive rates of each. She was/They were also counseled regarding diagnostic testing via amniocentesis. We reviewed the approximate 1 in 300-500 risk for complications for amniocentesis, including spontaneous preterm labor and delivery. After consideration of all the options, she declined NIPS and amniocentesis. She elected to proceed with ultrasound only.   A detailed ultrasound was performed today. The ultrasound report will be  sent under separate cover. There were no visualized fetal anomalies or markers suggestive of aneuploidy. Diagnostic testing was declined today.  She understands that screening tests cannot rule out all birth defects or genetic syndromes. The patient was advised of this limitation and states she still does not want additional testing at this time.   [name] was provided with written information regarding sickle cell anemia (SCA) including the carrier frequency and incidence in the African-American population, the availability of carrier testing and prenatal diagnosis if indicated.  In addition, we discussed that hemoglobinopathies are routinely screened for as part of the [place] newborn screening panel.  She declined hemoglobin electrophoresis today.  Both family histories were reviewed and found to be noncontributory for birth defects, intellectual disability, and known genetic conditions. Without further information regarding the provided family history, an accurate genetic risk cannot be calculated. Further genetic counseling is warranted if more information is obtained.  [name] denied exposure to environmental toxins or chemical agents. She denied the use of alcohol or street drugs. She reported smoking cigarettes occasionally during the pregnancy. The associations of smoking in pregnancy were reviewed and cessation encouraged. She denied significant viral illnesses during the course of her pregnancy. Her medical and surgical histories were noncontributory.   I counseled [name] regarding the above risks and available options.  The approximate face-to-face time with the genetic counselor was 40 minutes.  Quinn Plowman, MS,  Certified Genetic Counselor 09/16/2013

## 2013-09-21 LAB — OB RESULTS CONSOLE RPR: RPR: NONREACTIVE

## 2013-09-21 LAB — OB RESULTS CONSOLE HEPATITIS B SURFACE ANTIGEN: HEP B S AG: NEGATIVE

## 2013-09-21 LAB — OB RESULTS CONSOLE RUBELLA ANTIBODY, IGM: Rubella: IMMUNE

## 2013-09-21 LAB — OB RESULTS CONSOLE HIV ANTIBODY (ROUTINE TESTING): HIV: NONREACTIVE

## 2013-11-23 ENCOUNTER — Inpatient Hospital Stay (HOSPITAL_COMMUNITY)
Admission: AD | Admit: 2013-11-23 | Discharge: 2013-11-23 | Disposition: A | Discharge status: Home / Self Care | Payer: Medicaid Other | Source: Ambulatory Visit | Attending: Obstetrics | Admitting: Obstetrics

## 2013-11-23 ENCOUNTER — Encounter (HOSPITAL_COMMUNITY): Payer: Self-pay | Admitting: *Deleted

## 2013-11-23 DIAGNOSIS — R109 Unspecified abdominal pain: Secondary | ICD-10-CM | POA: Insufficient documentation

## 2013-11-23 DIAGNOSIS — N949 Unspecified condition associated with female genital organs and menstrual cycle: Secondary | ICD-10-CM | POA: Insufficient documentation

## 2013-11-23 DIAGNOSIS — E860 Dehydration: Secondary | ICD-10-CM | POA: Insufficient documentation

## 2013-11-23 DIAGNOSIS — O99891 Other specified diseases and conditions complicating pregnancy: Secondary | ICD-10-CM | POA: Insufficient documentation

## 2013-11-23 LAB — URINALYSIS, ROUTINE W REFLEX MICROSCOPIC
Glucose, UA: NEGATIVE mg/dL
Hgb urine dipstick: NEGATIVE
Specific Gravity, Urine: >1.03 — ABNORMAL HIGH (ref 1.005–1.030)

## 2013-11-23 LAB — URINE MICROSCOPIC-ADD ON

## 2013-11-23 MED ORDER — CYCLOBENZAPRINE HCL 10 MG PO TABS
10.0000 mg | ORAL_TABLET | Freq: Once | ORAL | Status: AC
  Administered 2013-11-23: 10 mg via ORAL
  Filled 2013-11-23: qty 1

## 2013-11-23 MED ORDER — CYCLOBENZAPRINE HCL 10 MG PO TABS: 10.0000 mg | ORAL_TABLET | Freq: Three times a day (TID) | ORAL | Status: DC | PRN

## 2013-11-23 MED ORDER — LACTATED RINGERS IV BOLUS (SEPSIS): 1000.0000 mL | Freq: Once | INTRAVENOUS | Status: DC

## 2013-11-23 NOTE — MAU Note (Signed)
Pt states she started having yesterday when she walked to the store. Pt complaining of pressure.

## 2013-11-23 NOTE — Progress Notes (Signed)
Pt self positioning sitting up in bed

## 2013-11-23 NOTE — MAU Note (Signed)
PT SAYS SHE  STARTED HAVING UC AND PRESSURE  ON Monday-  .  SAW DR MARSHALL- 3 WEEKS AGO .  NEXT APPOINTMENT ON 1-8.    IS A REPEAT C/S.    LAST SEX-  THIS  AM-   PAIN TOO BAD - HAD TO STOP .

## 2013-11-23 NOTE — Progress Notes (Signed)
Pt states she is having pressure.Pt states she has pain when she gets up and tries to walk.

## 2013-11-23 NOTE — MAU Provider Note (Signed)
History     CSN: [id number]  Arrival date and time: 11/23/13 2003   First Provider Initiated Contact with Patient 11/23/13 2110      No chief complaint on file.  HPI [name] is a 37 y.o. [phone] at [redacted]w[redacted]d who presents to MAU today with complaint of pelvic pain since yesterday. The patient states that the pain is worse with ambulation and changes of position. She denies vaginal bleeding, discharge, LOF, fever or UTI symptoms. She has had some nausea without vomiting or constipation. She has also had occasional loose stools recently. She reports good fetal movement.   OB History   Grav Para Term Preterm Abortions TAB SAB Ect Mult Living   7 3 3  3 2 1   3       Past Medical History  Diagnosis Date  . Depression   . PID (acute pelvic inflammatory disease)   . Gonorrhea     Past Surgical History  Procedure Laterality Date  . Breast surgery    . Induced abortion    . Cesarean section      X 3    Family History  Problem Relation Age of Onset  . Alcohol abuse Neg Hx   . Arthritis Neg Hx   . Asthma Neg Hx   . Birth defects Neg Hx   . Cancer Neg Hx   . COPD Neg Hx   . Depression Neg Hx   . Diabetes Neg Hx   . Drug abuse Neg Hx   . Early death Neg Hx   . Hearing loss Neg Hx   . Heart disease Neg Hx   . Hyperlipidemia Neg Hx   . Hypertension Neg Hx   . Kidney disease Neg Hx   . Learning disabilities Neg Hx   . Mental illness Neg Hx   . Mental retardation Neg Hx   . Miscarriages / Stillbirths Neg Hx   . Stroke Neg Hx   . Vision loss Neg Hx   . Varicose Veins Neg Hx     History  Substance Use Topics  . Smoking status: Current Every Day Smoker -- 0.25 packs/day    Types: Cigarettes  . Smokeless tobacco: Not on file  . Alcohol Use: No    Allergies: No Known Allergies  No prescriptions prior to admission    Review of Systems  Constitutional: Negative for fever and malaise/fatigue.  Gastrointestinal: Positive for nausea, abdominal pain and  diarrhea. Negative for vomiting and constipation.  Genitourinary: Negative for dysuria, urgency and frequency.   Physical Exam   Blood pressure 101/61, pulse 99, temperature 98.3 F (36.8 C), temperature source Oral, resp. rate 20, height 5\' 5"  (1.651 m), weight 197 lb 2 oz (89.415 kg), last menstrual period 03/24/2013.  Physical Exam  Constitutional: She is oriented to person, place, and time. She appears well-developed and well-nourished. No distress.  HENT:  Head: Normocephalic and atraumatic.  Cardiovascular: Normal rate, regular rhythm and normal heart sounds.   Respiratory: Effort normal and breath sounds normal. No respiratory distress.  GI: Soft. Bowel sounds are normal. She exhibits no distension and no mass. There is tenderness (mild suprapubic tenderness to palpation ). There is no rebound and no guarding.  Neurological: She is alert and oriented to person, place, and time.  Skin: Skin is warm and dry. No erythema.  Psychiatric: She has a normal mood and affect.  Dilation: Closed Effacement (%): Thick Cervical Position: Posterior Exam by:: B.Bethea RN  Results for orders placed  during the hospital encounter of 11/23/13 (from the past 24 hour(s))  URINALYSIS, ROUTINE W REFLEX MICROSCOPIC     Status: Abnormal   Collection Time    11/23/13  8:38 PM      Result Value Range   Color, Urine YELLOW  YELLOW   APPearance CLEAR  CLEAR   Specific Gravity, Urine >1.030 (*) 1.005 - 1.030   pH 6.0  5.0 - 8.0   Glucose, UA NEGATIVE  NEGATIVE mg/dL   Hgb urine dipstick NEGATIVE  NEGATIVE   Bilirubin Urine NEGATIVE  NEGATIVE   Ketones, ur >80 (*) NEGATIVE mg/dL   Protein, ur 30 (*) NEGATIVE mg/dL   Urobilinogen, UA 1.0  0.0 - 1.0 mg/dL   Nitrite NEGATIVE  NEGATIVE   Leukocytes, UA NEGATIVE  NEGATIVE  URINE MICROSCOPIC-ADD ON     Status: Abnormal   Collection Time    11/23/13  8:38 PM      Result Value Range   Squamous Epithelial / LPF RARE  RARE   WBC, UA 0-2  <3 WBC/hpf   RBC  / HPF 0-2  <3 RBC/hpf   Bacteria, UA FEW (*) RARE    Fetal Monitoring: Baseline: 145 bpm, moderate variability, + accelerations, no decelerations Contractions: none MAU Course  Procedures None  MDM UA today Cervix is closed. Reactive NST without contractions.  Flexeril given in MAU today UA shows signs of dehydration. Patient denies nausea. She would like to PO hydrate.   Assessment and Plan  A: Round ligament pain Dehydration  P: Discharge home Rx for Flexeril sent to patient's pharmacy Patient advised to increase PO hydration as tolerated Advised patient to use abdominal binder and warm baths PRN pain Patient encouraged to keep scheduled appointment for routine prenatal care with Dr. Gaynell Face Patient may return to MAU as needed or if her condition were to change or worsen  Freddi Starr, PA-C  11/23/2013, 10:27 PM

## 2013-12-02 ENCOUNTER — Other Ambulatory Visit: Payer: Self-pay | Admitting: Obstetrics

## 2013-12-16 ENCOUNTER — Encounter (HOSPITAL_COMMUNITY): Payer: Self-pay | Admitting: Pharmacist

## 2013-12-29 ENCOUNTER — Encounter (HOSPITAL_COMMUNITY): Payer: Self-pay

## 2013-12-29 ENCOUNTER — Encounter (HOSPITAL_COMMUNITY)
Admission: RE | Admit: 2013-12-29 | Discharge: 2013-12-29 | Disposition: A | Discharge status: Home / Self Care | Payer: Medicaid Other | Source: Ambulatory Visit | Attending: Obstetrics | Admitting: Obstetrics

## 2013-12-29 LAB — TYPE AND SCREEN
ABO/RH(D): AB NEG
Antibody Screen: NEGATIVE

## 2013-12-29 LAB — CBC
HCT: 35.6 % — ABNORMAL LOW (ref 36.0–46.0)
Hemoglobin: 12.1 g/dL (ref 12.0–15.0)
MCH: 27.7 pg (ref 26.0–34.0)
MCHC: 34 g/dL (ref 30.0–36.0)
MCV: 81.5 fL (ref 78.0–100.0)
PLATELETS: 278 10*3/uL (ref 150–400)
RBC: 4.37 MIL/uL (ref 3.87–5.11)
RDW: 14.7 % (ref 11.5–15.5)
WBC: 12 10*3/uL — AB (ref 4.0–10.5)

## 2013-12-29 LAB — RPR: RPR Ser Ql: NONREACTIVE

## 2013-12-29 NOTE — H&P (Signed)
[place]:  Kaufman, [name]                ACCOUNT NO.:  [id number]  MEDICAL RECORD NO.:  [id number]  LOCATION:                                 FACILITY:  PHYSICIAN:  Kathreen CosierBernard A. Blakley Michna, M.D.DATE OF BIRTH:  1976/05/04  DATE OF ADMISSION: DATE OF DISCHARGE:                             HISTORY & PHYSICAL   DATE OF OPERATION:  December 30, 2013  HISTORY OF PRESENT ILLNESS:  The patient is a 38 year old, gravida 6, para 3-0-2-3, due February 4 and she has had 2 previous C-sections.  She is in for repeat C-section at this time and tubal ligation.  Her GBS was negative and she is AB positive.  PAST MEDICAL HISTORY:  Sometimes her blood type shows up as Rh negative and sometimes as Rh positive, but she is really positive.  She is a mosaic.  PAST SURGICAL HISTORY:  Three previous C-sections and she is now at term.  SYSTEM REVIEW:  Negative.  PHYSICAL EXAM:  GENERAL:  Well-developed female, in no distress. HEENT:  Negative. LUNGS:  Clear to P and A. HEART:  Regular rhythm.  No murmurs, no gallops. BREASTS:  Negative. ABDOMEN:  Term. PELVIC:  Deferred. EXTREMITIES:  Negative.          ______________________________ Kathreen CosierBernard A. Sol Englert, M.D.     BAM/MEDQ  D:  12/28/2013  T:  12/29/2013  Job:  [id number]

## 2013-12-29 NOTE — Patient Instructions (Signed)
20 [name]  12/29/2013   Your procedure is scheduled on:  12/30/13  [facility] at 6 AM.  Pick up the phone at the desk and dial [phone].   Call this number if you have problems the morning of surgery: [phone]   Remember:   Do not eat food:After Midnight.  Do not drink clear liquids: After Midnight.  Take these medicines the morning of surgery with A SIP OF WATER: NA   Do not wear jewelry, make-up or nail polish.  Do not wear lotions, powders, or perfumes. You may wear deodorant.  Do not shave 48 hours prior to surgery.  Do not bring valuables to the hospital.  [facility] is not   responsible for any belongings or valuables brought to the hospital.  Contacts, dentures or bridgework may not be worn into surgery.  Leave suitcase in the car. After surgery it may be brought to your room.  For patients admitted to the hospital, checkout time is 11:00 AM the day of              discharge.   Patients discharged the day of surgery will not be allowed to drive             home.  Name and phone number of your driver: NA  Special Instructions:   Shower using CHG 2 nights before surgery and the night before surgery.  If you shower the day of surgery use CHG.  Use special wash - you have one bottle of CHG for all showers.  You should use approximately 1/3 of the bottle for each shower.   Please read over the following fact sheets that you were given:   Surgical Site Infection Prevention

## 2013-12-29 NOTE — Anesthesia Preprocedure Evaluation (Addendum)
Anesthesia Evaluation  Patient identified by MRN, date of birth, ID band Patient awake    Reviewed: Allergy & Precautions, H&P , NPO status , Patient's Chart, lab work & pertinent test results  Airway Mallampati: II TM Distance: >3 FB Neck ROM: Full    Dental  (+) Dental Advisory Given and Teeth Intact   Pulmonary former smoker,  breath sounds clear to auscultation        Cardiovascular negative cardio ROS  Rhythm:Regular Rate:Normal     Neuro/Psych PSYCHIATRIC DISORDERS Depression negative neurological ROS     GI/Hepatic negative GI ROS, Neg liver ROS,   Endo/Other  negative endocrine ROS  Renal/GU negative Renal ROS     Musculoskeletal negative musculoskeletal ROS (+)   Abdominal   Peds  Hematology negative hematology ROS (+)   Anesthesia Other Findings   Reproductive/Obstetrics negative OB ROS                          Anesthesia Physical Anesthesia Plan  ASA: II  Anesthesia Plan: Spinal   Post-op Pain Management:    Induction:   Airway Management Planned:   Additional Equipment:   Intra-op Plan:   Post-operative Plan:   Informed Consent: I have reviewed the patients History and Physical, chart, labs and discussed the procedure including the risks, benefits and alternatives for the proposed anesthesia with the patient or authorized representative who has indicated his/her understanding and acceptance.   Dental advisory given  Plan Discussed with: CRNA  Anesthesia Plan Comments:         Anesthesia Quick Evaluation

## 2013-12-30 ENCOUNTER — Encounter (HOSPITAL_COMMUNITY): Payer: Medicaid Other | Admitting: Anesthesiology

## 2013-12-30 ENCOUNTER — Encounter (HOSPITAL_COMMUNITY): Payer: Self-pay | Admitting: Anesthesiology

## 2013-12-30 ENCOUNTER — Inpatient Hospital Stay (HOSPITAL_COMMUNITY): Payer: Medicaid Other | Admitting: Anesthesiology

## 2013-12-30 ENCOUNTER — Inpatient Hospital Stay (HOSPITAL_COMMUNITY)
Admission: RE | Admit: 2013-12-30 | Discharge: 2014-01-01 | DRG: 766 | Disposition: A | Discharge status: Home / Self Care | Payer: Medicaid Other | Source: Ambulatory Visit | Attending: Obstetrics | Admitting: Obstetrics

## 2013-12-30 ENCOUNTER — Encounter (HOSPITAL_COMMUNITY)
Admission: RE | Disposition: A | Discharge status: Home / Self Care | Payer: Self-pay | Source: Ambulatory Visit | Attending: Obstetrics

## 2013-12-30 DIAGNOSIS — O360990 Maternal care for other rhesus isoimmunization, unspecified trimester, not applicable or unspecified: Secondary | ICD-10-CM | POA: Diagnosis present

## 2013-12-30 DIAGNOSIS — O99344 Other mental disorders complicating childbirth: Secondary | ICD-10-CM | POA: Diagnosis present

## 2013-12-30 DIAGNOSIS — F3289 Other specified depressive episodes: Secondary | ICD-10-CM | POA: Diagnosis present

## 2013-12-30 DIAGNOSIS — Z87891 Personal history of nicotine dependence: Secondary | ICD-10-CM

## 2013-12-30 DIAGNOSIS — Z302 Encounter for sterilization: Secondary | ICD-10-CM

## 2013-12-30 DIAGNOSIS — F329 Major depressive disorder, single episode, unspecified: Secondary | ICD-10-CM | POA: Diagnosis present

## 2013-12-30 DIAGNOSIS — O09529 Supervision of elderly multigravida, unspecified trimester: Secondary | ICD-10-CM | POA: Diagnosis present

## 2013-12-30 DIAGNOSIS — Z98891 History of uterine scar from previous surgery: Secondary | ICD-10-CM

## 2013-12-30 DIAGNOSIS — O0940 Supervision of pregnancy with grand multiparity, unspecified trimester: Secondary | ICD-10-CM

## 2013-12-30 DIAGNOSIS — O34219 Maternal care for unspecified type scar from previous cesarean delivery: Principal | ICD-10-CM | POA: Diagnosis present

## 2013-12-30 SURGERY — Surgical Case
Anesthesia: Spinal | Site: Abdomen | Laterality: Bilateral

## 2013-12-30 MED ORDER — PHENYLEPHRINE HCL 10 MG/ML IJ SOLN
INTRAMUSCULAR | Status: DC | PRN
  Administered 2013-12-30: 40 ug via INTRAVENOUS
  Administered 2013-12-30 (×2): 80 ug via INTRAVENOUS

## 2013-12-30 MED ORDER — KETOROLAC TROMETHAMINE 60 MG/2ML IM SOLN
60.0000 mg | Freq: Once | INTRAMUSCULAR | Status: AC | PRN
  Administered 2013-12-30: 60 mg via INTRAMUSCULAR

## 2013-12-30 MED ORDER — LACTATED RINGERS IV SOLN
Freq: Once | INTRAVENOUS | Status: AC
  Administered 2013-12-30: 06:00:00 via INTRAVENOUS

## 2013-12-30 MED ORDER — FENTANYL CITRATE 0.05 MG/ML IJ SOLN
INTRAMUSCULAR | Status: AC
Start: 2013-12-30 — End: 2013-12-30
  Filled 2013-12-30: qty 2

## 2013-12-30 MED ORDER — OXYCODONE-ACETAMINOPHEN 5-325 MG PO TABS
1.0000 | ORAL_TABLET | ORAL | Status: DC | PRN
  Administered 2013-12-30 – 2013-12-31 (×2): 1 via ORAL
  Administered 2013-12-31 – 2014-01-01 (×6): 2 via ORAL
  Filled 2013-12-30 (×3): qty 2
  Filled 2013-12-30 (×2): qty 1
  Filled 2013-12-30 (×3): qty 2

## 2013-12-30 MED ORDER — LACTATED RINGERS IV SOLN
INTRAVENOUS | Status: DC | PRN
  Administered 2013-12-30: 08:00:00 via INTRAVENOUS

## 2013-12-30 MED ORDER — PHENYLEPHRINE 8 MG IN D5W 100 ML (0.08MG/ML) PREMIX OPTIME
INJECTION | INTRAVENOUS | Status: AC
  Filled 2013-12-30: qty 100

## 2013-12-30 MED ORDER — MEPERIDINE HCL 25 MG/ML IJ SOLN: 6.2500 mg | INTRAMUSCULAR | Status: DC | PRN

## 2013-12-30 MED ORDER — NALOXONE HCL 0.4 MG/ML IJ SOLN: 0.4000 mg | INTRAMUSCULAR | Status: DC | PRN

## 2013-12-30 MED ORDER — DIBUCAINE 1 % RE OINT: 1.0000 "application " | TOPICAL_OINTMENT | RECTAL | Status: DC | PRN

## 2013-12-30 MED ORDER — ZOLPIDEM TARTRATE 5 MG PO TABS: 5.0000 mg | ORAL_TABLET | Freq: Every evening | ORAL | Status: DC | PRN

## 2013-12-30 MED ORDER — PHENYLEPHRINE 8 MG IN D5W 100 ML (0.08MG/ML) PREMIX OPTIME
INJECTION | INTRAVENOUS | Status: DC | PRN
  Administered 2013-12-30: 60 ug/min via INTRAVENOUS

## 2013-12-30 MED ORDER — LACTATED RINGERS IV SOLN
INTRAVENOUS | Status: DC
  Administered 2013-12-30 (×3): via INTRAVENOUS

## 2013-12-30 MED ORDER — SENNOSIDES-DOCUSATE SODIUM 8.6-50 MG PO TABS
2.0000 | ORAL_TABLET | ORAL | Status: DC
  Administered 2013-12-30 – 2013-12-31 (×2): 2 via ORAL
  Filled 2013-12-30 (×2): qty 2

## 2013-12-30 MED ORDER — SIMETHICONE 80 MG PO CHEW
80.0000 mg | CHEWABLE_TABLET | ORAL | Status: DC | PRN
  Administered 2014-01-01: 80 mg via ORAL
  Filled 2013-12-30: qty 1

## 2013-12-30 MED ORDER — LACTATED RINGERS IV SOLN
INTRAVENOUS | Status: DC
  Administered 2013-12-30: 17:00:00 via INTRAVENOUS

## 2013-12-30 MED ORDER — METOCLOPRAMIDE HCL 5 MG/ML IJ SOLN: 10.0000 mg | Freq: Once | INTRAMUSCULAR | Status: DC | PRN

## 2013-12-30 MED ORDER — PHENYLEPHRINE 40 MCG/ML (10ML) SYRINGE FOR IV PUSH (FOR BLOOD PRESSURE SUPPORT)
PREFILLED_SYRINGE | INTRAVENOUS | Status: AC
  Filled 2013-12-30: qty 5

## 2013-12-30 MED ORDER — OXYTOCIN 10 UNIT/ML IJ SOLN
INTRAMUSCULAR | Status: AC
  Filled 2013-12-30: qty 4

## 2013-12-30 MED ORDER — SCOPOLAMINE 1 MG/3DAYS TD PT72: 1.0000 | MEDICATED_PATCH | Freq: Once | TRANSDERMAL | Status: DC

## 2013-12-30 MED ORDER — PRENATAL MULTIVITAMIN CH
1.0000 | ORAL_TABLET | Freq: Every day | ORAL | Status: DC
  Administered 2013-12-31 – 2014-01-01 (×2): 1 via ORAL
  Filled 2013-12-30 (×2): qty 1

## 2013-12-30 MED ORDER — DIPHENHYDRAMINE HCL 50 MG/ML IJ SOLN
25.0000 mg | INTRAMUSCULAR | Status: DC | PRN
  Administered 2013-12-30: 25 mg via INTRAMUSCULAR
  Filled 2013-12-30: qty 1

## 2013-12-30 MED ORDER — SIMETHICONE 80 MG PO CHEW
80.0000 mg | CHEWABLE_TABLET | ORAL | Status: DC
  Administered 2013-12-30 – 2013-12-31 (×2): 80 mg via ORAL
  Filled 2013-12-30 (×2): qty 1

## 2013-12-30 MED ORDER — ONDANSETRON HCL 4 MG/2ML IJ SOLN: 4.0000 mg | INTRAMUSCULAR | Status: DC | PRN

## 2013-12-30 MED ORDER — ONDANSETRON HCL 4 MG/2ML IJ SOLN
INTRAMUSCULAR | Status: DC | PRN
  Administered 2013-12-30: 4 mg via INTRAVENOUS

## 2013-12-30 MED ORDER — CEFAZOLIN SODIUM-DEXTROSE 2-3 GM-% IV SOLR
2.0000 g | Freq: Once | INTRAVENOUS | Status: AC
  Administered 2013-12-30: 2 g via INTRAVENOUS

## 2013-12-30 MED ORDER — OXYTOCIN 40 UNITS IN LACTATED RINGERS INFUSION - SIMPLE MED: 62.5000 mL/h | INTRAVENOUS | Status: AC

## 2013-12-30 MED ORDER — MENTHOL 3 MG MT LOZG: 1.0000 | LOZENGE | OROMUCOSAL | Status: DC | PRN

## 2013-12-30 MED ORDER — METOCLOPRAMIDE HCL 5 MG/ML IJ SOLN: 10.0000 mg | Freq: Three times a day (TID) | INTRAMUSCULAR | Status: DC | PRN

## 2013-12-30 MED ORDER — SIMETHICONE 80 MG PO CHEW
80.0000 mg | CHEWABLE_TABLET | Freq: Three times a day (TID) | ORAL | Status: DC
  Administered 2013-12-30 – 2014-01-01 (×5): 80 mg via ORAL
  Filled 2013-12-30 (×5): qty 1

## 2013-12-30 MED ORDER — KETOROLAC TROMETHAMINE 30 MG/ML IJ SOLN: 30.0000 mg | Freq: Four times a day (QID) | INTRAMUSCULAR | Status: AC | PRN

## 2013-12-30 MED ORDER — MORPHINE SULFATE (PF) 0.5 MG/ML IJ SOLN
INTRAMUSCULAR | Status: DC | PRN
  Administered 2013-12-30: .15 mg via INTRATHECAL

## 2013-12-30 MED ORDER — BUPIVACAINE IN DEXTROSE 0.75-8.25 % IT SOLN
INTRATHECAL | Status: DC | PRN
  Administered 2013-12-30: 1.6 mL via INTRATHECAL

## 2013-12-30 MED ORDER — FENTANYL CITRATE 0.05 MG/ML IJ SOLN
25.0000 ug | INTRAMUSCULAR | Status: DC | PRN
Start: 2013-12-30 — End: 2013-12-30

## 2013-12-30 MED ORDER — WITCH HAZEL-GLYCERIN EX PADS: 1.0000 "application " | MEDICATED_PAD | CUTANEOUS | Status: DC | PRN

## 2013-12-30 MED ORDER — SODIUM CHLORIDE 0.9 % IJ SOLN: 3.0000 mL | INTRAMUSCULAR | Status: DC | PRN

## 2013-12-30 MED ORDER — KETOROLAC TROMETHAMINE 60 MG/2ML IM SOLN
INTRAMUSCULAR | Status: AC
  Filled 2013-12-30: qty 2

## 2013-12-30 MED ORDER — INFLUENZA VAC SPLIT QUAD 0.5 ML IM SUSP
0.5000 mL | INTRAMUSCULAR | Status: AC
  Administered 2014-01-01: 0.5 mL via INTRAMUSCULAR
  Filled 2013-12-30: qty 0.5

## 2013-12-30 MED ORDER — LANOLIN HYDROUS EX OINT: 1.0000 "application " | TOPICAL_OINTMENT | CUTANEOUS | Status: DC | PRN

## 2013-12-30 MED ORDER — DIPHENHYDRAMINE HCL 25 MG PO CAPS: 25.0000 mg | ORAL_CAPSULE | Freq: Four times a day (QID) | ORAL | Status: DC | PRN

## 2013-12-30 MED ORDER — NALBUPHINE HCL 10 MG/ML IJ SOLN
5.0000 mg | INTRAMUSCULAR | Status: DC | PRN
  Filled 2013-12-30: qty 1

## 2013-12-30 MED ORDER — OXYTOCIN 40 UNITS IN LACTATED RINGERS INFUSION - SIMPLE MED
INTRAVENOUS | Status: DC | PRN
  Administered 2013-12-30: 40 [IU] via INTRAVENOUS

## 2013-12-30 MED ORDER — IBUPROFEN 600 MG PO TABS
600.0000 mg | ORAL_TABLET | Freq: Four times a day (QID) | ORAL | Status: DC
  Administered 2013-12-30 – 2014-01-01 (×8): 600 mg via ORAL
  Filled 2013-12-30 (×8): qty 1

## 2013-12-30 MED ORDER — DIPHENHYDRAMINE HCL 25 MG PO CAPS
25.0000 mg | ORAL_CAPSULE | ORAL | Status: DC | PRN
  Filled 2013-12-30: qty 1

## 2013-12-30 MED ORDER — NALOXONE HCL 1 MG/ML IJ SOLN
1.0000 ug/kg/h | INTRAVENOUS | Status: DC | PRN
  Filled 2013-12-30: qty 2

## 2013-12-30 MED ORDER — FENTANYL CITRATE 0.05 MG/ML IJ SOLN
INTRAMUSCULAR | Status: DC | PRN
  Administered 2013-12-30: 25 ug via INTRATHECAL

## 2013-12-30 MED ORDER — ONDANSETRON HCL 4 MG/2ML IJ SOLN: 4.0000 mg | Freq: Three times a day (TID) | INTRAMUSCULAR | Status: DC | PRN

## 2013-12-30 MED ORDER — ONDANSETRON HCL 4 MG/2ML IJ SOLN
INTRAMUSCULAR | Status: AC
  Filled 2013-12-30: qty 2

## 2013-12-30 MED ORDER — DIPHENHYDRAMINE HCL 50 MG/ML IJ SOLN: 12.5000 mg | INTRAMUSCULAR | Status: DC | PRN

## 2013-12-30 MED ORDER — ONDANSETRON HCL 4 MG PO TABS: 4.0000 mg | ORAL_TABLET | ORAL | Status: DC | PRN

## 2013-12-30 MED ORDER — MORPHINE SULFATE 0.5 MG/ML IJ SOLN
INTRAMUSCULAR | Status: AC
  Filled 2013-12-30: qty 10

## 2013-12-30 MED ORDER — SCOPOLAMINE 1 MG/3DAYS TD PT72
1.0000 | MEDICATED_PATCH | Freq: Once | TRANSDERMAL | Status: DC
  Administered 2013-12-30: 1.5 mg via TRANSDERMAL

## 2013-12-30 MED ORDER — TETANUS-DIPHTH-ACELL PERTUSSIS 5-2.5-18.5 LF-MCG/0.5 IM SUSP
0.5000 mL | Freq: Once | INTRAMUSCULAR | Status: AC
  Administered 2013-12-31: 0.5 mL via INTRAMUSCULAR

## 2013-12-30 MED ORDER — NALBUPHINE HCL 10 MG/ML IJ SOLN
5.0000 mg | INTRAMUSCULAR | Status: DC | PRN
  Administered 2013-12-30: 10 mg via INTRAVENOUS

## 2013-12-30 SURGICAL SUPPLY — 32 items
CLAMP CORD UMBIL (MISCELLANEOUS) IMPLANT
CLOTH BEACON ORANGE TIMEOUT ST (SAFETY) ×3 IMPLANT
CONTAINER PREFILL 10% NBF 15ML (MISCELLANEOUS) ×6 IMPLANT
DERMABOND ADVANCED (GAUZE/BANDAGES/DRESSINGS) ×2
DERMABOND ADVANCED .7 DNX12 (GAUZE/BANDAGES/DRESSINGS) ×1 IMPLANT
DRAPE LG THREE QUARTER DISP (DRAPES) IMPLANT
DRSG OPSITE POSTOP 4X10 (GAUZE/BANDAGES/DRESSINGS) ×3 IMPLANT
DURAPREP 26ML APPLICATOR (WOUND CARE) ×3 IMPLANT
ELECT REM PT RETURN 9FT ADLT (ELECTROSURGICAL) ×3
ELECTRODE REM PT RTRN 9FT ADLT (ELECTROSURGICAL) ×1 IMPLANT
EXTRACTOR VACUUM M CUP 4 TUBE (SUCTIONS) IMPLANT
EXTRACTOR VACUUM M CUP 4' TUBE (SUCTIONS)
GLOVE BIO SURGEON STRL SZ8.5 (GLOVE) ×3 IMPLANT
GOWN STRL REUS W/TWL 2XL LVL3 (GOWN DISPOSABLE) ×3 IMPLANT
GOWN STRL REUS W/TWL LRG LVL3 (GOWN DISPOSABLE) ×3 IMPLANT
KIT ABG SYR 3ML LUER SLIP (SYRINGE) IMPLANT
NEEDLE HYPO 25X5/8 SAFETYGLIDE (NEEDLE) IMPLANT
NS IRRIG 1000ML POUR BTL (IV SOLUTION) ×3 IMPLANT
PACK C SECTION WH (CUSTOM PROCEDURE TRAY) ×3 IMPLANT
PAD OB MATERNITY 4.3X12.25 (PERSONAL CARE ITEMS) ×3 IMPLANT
SUT CHROMIC 0 CT 802H (SUTURE) ×3 IMPLANT
SUT CHROMIC 1 CTX 36 (SUTURE) ×6 IMPLANT
SUT CHROMIC 2 0 SH (SUTURE) ×3 IMPLANT
SUT GUT PLAIN 0 CT-3 TAN 27 (SUTURE) ×3 IMPLANT
SUT MON AB 4-0 PS1 27 (SUTURE) ×3 IMPLANT
SUT VIC AB 0 CT1 18XCR BRD8 (SUTURE) IMPLANT
SUT VIC AB 0 CT1 8-18 (SUTURE)
SUT VIC AB 0 CTX 36 (SUTURE) ×2
SUT VIC AB 0 CTX36XBRD ANBCTRL (SUTURE) ×1 IMPLANT
TOWEL OR 17X24 6PK STRL BLUE (TOWEL DISPOSABLE) ×3 IMPLANT
TRAY FOLEY CATH 14FR (SET/KITS/TRAYS/PACK) ×3 IMPLANT
WATER STERILE IRR 1000ML POUR (IV SOLUTION) IMPLANT

## 2013-12-30 NOTE — Op Note (Signed)
preop diagnosis previous cesarean section x3 multiparity Postop diagnosis repeat low transverse cesarean section and tubal ligation Surgeon Dr. Francoise CeoBernard Landen Breeland First assistant Dr. Coral Ceoharles Harper Anesthesia spinal Procedure patient placed on the operating table in the supine position abdomen prepped and draped bladder emptied with a Foley catheter a transverse suprapubic incision made through the old scar and carried to the rectus fascia fascia cleaned and incised the length of the incision the peritoneum incised longitudinally transverse incision made on the visceroperitoneum above the bladder and the bladder mobilized inferiorly transverse low uterine incision made the fluid was clear she had polyhydramnios approximately 2000 cc of fluid she was delivered of a female from that L. OA position Apgars 89 the placenta was posterior removed manually and sent to labor and delivery uterine incision closed in one layer after the uterus was cleaned with dry laps hemostasis satisfactory the right tube GRAS in the midportion with a Babcock clamp 0 plain suture placed in the mesosalpinx below the portion of tube within the clamp tied an approximately 1 inch of tube removed procedure done in a similar fashion on the other side lap and sponge counts correct ovaries normal abdomen closed in layers peritoneum continuous with 2 0-0 chromic fascia continuous suture of 0 Dexon skin closed with subcuticular stitch of 4-0 Monocryl blood loss 800 cc patient tolerated the procedure well

## 2013-12-30 NOTE — H&P (Signed)
  There has been no  Change since original dictation

## 2013-12-30 NOTE — Anesthesia Postprocedure Evaluation (Signed)
  Anesthesia Post Note  Patient: [name]  Procedure(s) Performed: Procedure(s) (LRB): CESAREAN SECTION WITH BILATERAL TUBAL LIGATION (Bilateral)  Anesthesia type: Spinal  Patient location: PACU  Post pain: Pain level controlled  Post assessment: Post-op Vital signs reviewed  Last Vitals:  Filed Vitals:   12/30/13 0900  BP: 86/45  Pulse:   Temp:   Resp:     Post vital signs: Reviewed  Level of consciousness: awake  Complications: No apparent anesthesia complications

## 2013-12-30 NOTE — Anesthesia Procedure Notes (Signed)
Spinal  Patient location during procedure: OR Start time: 12/30/2013 7:28 AM Staffing Anesthesiologist: Brayton CavesJACKSON, Marigene Erler Performed by: anesthesiologist  Preanesthetic Checklist Completed: patient identified, site marked, surgical consent, pre-op evaluation, timeout performed, IV checked, risks and benefits discussed and monitors and equipment checked Spinal Block Patient position: sitting Prep: DuraPrep Patient monitoring: heart rate, cardiac monitor, continuous pulse ox and blood pressure Approach: midline Location: L3-4 Injection technique: single-shot Needle Needle type: Sprotte  Needle gauge: 24 G Needle length: 9 cm Assessment Sensory level: T4 Additional Notes Patient identified.  Risk benefits discussed including failed block, incomplete pain control, headache, nerve damage, paralysis, blood pressure changes, nausea, vomiting, reactions to medication both toxic or allergic, and postpartum back pain.  Patient expressed understanding and wished to proceed.  All questions were answered.  Sterile technique used throughout procedure.  CSF was clear.  No parasthesia or other complications.  Please see nursing notes for vital signs.

## 2013-12-30 NOTE — Transfer of Care (Signed)
Immediate Anesthesia Transfer of Care Note  Patient: [name]  Procedure(s) Performed: Procedure(s): CESAREAN SECTION WITH BILATERAL TUBAL LIGATION (Bilateral)  Patient Location: PACU  Anesthesia Type:Spinal  Level of Consciousness: awake, alert  and oriented  Airway & Oxygen Therapy: Patient Spontanous Breathing  Post-op Assessment: Report given to PACU RN and Post -op Vital signs reviewed and stable  Post vital signs: Reviewed and stable  Complications: No apparent anesthesia complications

## 2013-12-31 ENCOUNTER — Encounter (HOSPITAL_COMMUNITY): Payer: Self-pay | Admitting: Obstetrics

## 2013-12-31 LAB — CBC
HCT: 33.3 % — ABNORMAL LOW (ref 36.0–46.0)
HEMOGLOBIN: 11.3 g/dL — AB (ref 12.0–15.0)
MCH: 27.7 pg (ref 26.0–34.0)
MCHC: 33.9 g/dL (ref 30.0–36.0)
MCV: 81.6 fL (ref 78.0–100.0)
Platelets: 227 10*3/uL (ref 150–400)
RBC: 4.08 MIL/uL (ref 3.87–5.11)
RDW: 14.6 % (ref 11.5–15.5)
WBC: 13.8 10*3/uL — ABNORMAL HIGH (ref 4.0–10.5)

## 2013-12-31 LAB — BIRTH TISSUE RECOVERY COLLECTION (PLACENTA DONATION)

## 2013-12-31 MED ORDER — RHO D IMMUNE GLOBULIN 1500 UNIT/2ML IJ SOLN
300.0000 ug | Freq: Once | INTRAMUSCULAR | Status: AC
  Administered 2013-12-31: 300 ug via INTRAMUSCULAR
  Filled 2013-12-31: qty 2

## 2013-12-31 NOTE — Clinical Social Work Maternal (Signed)
Clinical Social Work Department PSYCHOSOCIAL ASSESSMENT - MATERNAL/CHILD 12/31/2013  Patient:  [name], [name]  Account Number:  [id number]  Admit Date:  12/30/2013  Ardine Eng Name:   Ronnie Derby    Clinical Social Worker:  Gerri Spore, LCSW   Date/Time:  12/31/2013 11:59 AM  Date Referred:  12/31/2013   Referral source  CN     Referred reason  Depression/Anxiety   Other referral source:    I:  FAMILY / [place] legal guardian:  PARENT  Guardian - Name Guardian - Age Guardian - Address  [name] 37 [address]; [place], [place] [address]  [name], [name]     Other household support members/support persons Name Relationship DOB   DAUGHTER 2003 (lives in [place] with her father)   [name] 2007   SON 2009   Other support:   [name]- Pts friend    II  PSYCHOSOCIAL DATA Information Source:  Patient Interview  Occupational hygienist Employment:   Museum/gallery curator resources:  [employer] [place]:  [place] / Grade:   Maternity Care Coordinator / Child Services Coordination / Early Interventions:  Cultural issues impacting care:    [name]  STRENGTHS Strengths  Adequate Resources  Home prepared for Child (including basic supplies)  Supportive family/friends   Strength comment:    IV  RISK FACTORS AND CURRENT PROBLEMS Current Problem:  YES   Risk Factor & Current Problem Patient Issue Family Issue Risk Factor / Current Problem Comment  Mental Illness Y N Depression    V  SOCIAL WORK ASSESSMENT CSW met with pt to discuss her depression symptoms & offer resources as needed.  Pt was diagnosed with depression 2 years ago.  Pt had recently loss her father & said "a lot" was going on at that time.  Her symptoms were never treated with medication however she did participate in therapy for 2 months.  Pts states her depression has improved but she continues to feel depressed sometimes.  Pt experienced  PP depression after the birth of her 2nd child.  Symptoms were not treated.  She is not interested in medication at this time but expressed interest in counseling referrals.   CSW provided pt with 3 counseling options & encouraged her to follow up.  She denies any SI/HI history.  Pt identified the FOB & her friend as her primary support system.  CSW briefly discussed signs/symptoms of PP depression & pt seemed receptive to information.  CSW will continue to follow & assist as needed until discharge.      VI SOCIAL WORK PLAN Social Work Plan  No Further Intervention Required / No Barriers to Discharge   Type of pt/family education:   If child protective services report - county:   If child protective services report - date:   Information/referral to community resources comment:   Other social work plan:

## 2013-12-31 NOTE — Progress Notes (Signed)
Patient ID: Elizabeth GuadalajaraFelicia S Kaufman, female   DOB: 05-18-1976, 38 y.o.   MRN: [id number] Postop day 1 Vital signs normal Fundus firm Incision clean and dry Legs doing well

## 2013-12-31 NOTE — Progress Notes (Signed)
UR chart review completed.  

## 2013-12-31 NOTE — Lactation Note (Signed)
This note was copied from the chart of Elizabeth Guadlupe SpanishFelicia Veilleux. Lactation Consultation Note  Patient Name: [name] ZOXWR'UToday's Date: 12/31/2013 Reason for consult: Initial assessment;Other (Comment) (charting for exclusion)   Maternal Data Formula Feeding for Exclusion: Yes Reason for exclusion: Mother's choice to formula feed on admision  Feeding    LATCH Score/Interventions                      Lactation Tools Discussed/Used     Consult Status Consult Status: Complete    Lynda RainwaterBryant, Jenetta Wease [place] 12/31/2013, 3:42 PM

## 2014-01-01 LAB — RH IG WORKUP (INCLUDES ABO/RH)
ABO/RH(D): AB NEG
FETAL SCREEN: NEGATIVE
Gestational Age(Wks): 40.1
UNIT DIVISION: 0

## 2014-01-01 MED ORDER — OXYCODONE-ACETAMINOPHEN 5-325 MG PO TABS: 1.0000 | ORAL_TABLET | ORAL | Status: DC | PRN

## 2014-01-01 NOTE — Discharge Summary (Signed)
  Obstetric Discharge Summary Reason for Admission: cesarean section Prenatal Procedures: none Intrapartum Procedures: cesarean: low cervical, transverse and tubal ligation Postpartum Procedures: none Complications-Operative and Postpartum: none  Hemoglobin  Date Value Range Status  12/31/2013 11.3* 12.0 - 15.0 g/dL Final     HCT  Date Value Range Status  12/31/2013 33.3* 36.0 - 46.0 % Final    Physical Exam:  General: alert Lochia: appropriate Uterine: firm Incision: clean, dry, intact and ecchymotic area superior to the incision DVT Evaluation: No evidence of DVT seen on physical exam.  Discharge Diagnoses: Active Problems:   S/P cesarean section   Discharge Information: Date: 01/01/2014 Activity: pelvic rest Diet: routine Medications:  Prior to Admission medications   Medication Sig Start Date End Date Taking? Authorizing Provider  oxyCODONE-acetaminophen (PERCOCET/ROXICET) 5-325 MG per tablet Take 1-2 tablets by mouth every 4 (four) hours as needed for severe pain (moderate - severe pain). 01/01/14   Elizabeth CharLisa [name], MD    Condition: stable Instructions: refer to routine discharge instructions Discharge to: home Follow-up Information   Schedule an appointment as soon as possible for Kaufman visit with [name],Elizabeth A, MD.   Specialty:  Obstetrics and Gynecology   Contact information:   [address] [place] [id number] [phone]       Newborn Data:  Live born female  Birth Weight: 7 lb 9.3 oz (3440 g) APGAR: 8, 9   Home with mother.  Kaufman,[name] Kaufman 01/01/2014, 11:04 AM

## 2014-01-01 NOTE — Discharge Instructions (Signed)
Cesarean Section (Postpartum Care) °These discharge instructions provide you with general information on cesarean section (caesarean, cesarian, caesarian) and caring for yourself after you leave the hospital. Your caregiver may also give you specific instructions. °Please read these instructions and refer to them in the next few weeks. If you have any questions regarding these instructions, you may call the nursing unit. If you have any problems after discharge, please call your doctor. If you are unable to reach your doctor, you should seek help at the nearest Emergency Department. °ACTIVITY °· Rest as much as possible the first two weeks at home.  °· When possible, have someone help you with your household activities and the new baby for 2 to 3 weeks.  °· Limit your housework and social activity. Increase your activity gradually as your strength returns.  °· Do not climb stairs more than two or three times a day.  °· Do not lift anything heavier than your baby.  °· Follow your doctor's instructions about driving a car.  °· Limit wearing support panties or control-top hose, since relying on your own muscles helps strengthen them.  °· Ask your doctor about exercises.  °NUTRITION °· You may return to your usual diet.  °· Drink 6 to 8 glasses of fluid a day.  °· Eat a well-balanced diet. Include portions of food from the meat/protein, milk, fruit, vegetable and bread groups.  °· Keep taking your prenatal or multivitamins.  °ELIMINATION °You should return to your usual bowel function. If constipation is a problem, you may take a mild laxative such as Milk of MagnesiaÔ with your caregiver’s permission. Gradually add fruit, vegetables and bran to your diet. Make sure to increase your fluids. °HYGIENE °You may shower, wash your hair and take tub baths unless your doctor tells you otherwise. Continue peri-care until your vaginal bleeding and discharge stops. Do not douche or use tampons until your caregiver says it is  OK. °FEVER °If you feel feverish or have shaking chills, take your temperature. If your temperature is 101° F (38.3° C), or is 100.4° F (38° C) two times in a four hour period, call your doctor. The fever may indicate infection. If you call early, infection can be treated with medicines that kill germs (antibiotics). Hospitalization may be avoided. °PAIN CONTROL °You may still have mild discomfort. Only take over-the-counter or prescription medicine as directed by your caregiver. Do not take aspirin; it can cause bleeding. If the pain is not relieved by your medicine or becomes worse, call your caregiver. °INCISION CARE °Clean your cut (incision) gently with soap and water. If your caregiver says it is okay, leave the incision without a dressing unless it is draining or irritated. If you have small adhesive strips across the incision and they do not fall off within 7 days, carefully peel them off. Check the incision daily for increased redness, drainage, swelling or separation of skin. Call your caregiver if any of these happen. °VAGINAL CARE °You may have a vaginal discharge or bleeding for up to 6 weeks. If the vaginal discharge becomes bright red, bad smelling, heavy in amount, has blood clots or if you have burning or frequency when urinating, call your caregiver. °SEXUAL INTERCOURSE °It is best to follow your caregiver's advice about when you may safely resume sexual intercourse. Most women can begin to have intercourse two to three weeks after their baby's birth. °You can become pregnant before you have a period. If you decide to have sexual intercourse, you should use   birth control if you do not want to become pregnant right away. ° °BREAST CARE °If you are not breastfeeding and your breasts become tender, hard or leak milk, you may wear a firm fitting bra and apply ice to the breasts. If you are breastfeeding, wear a good support bra. Call your caregiver if you have breast pain, flu-like symptoms, fever or  hardness and reddening of your breasts. °POSTPARTUM BLUES °After the excitement of having the baby goes away, you may commonly have a period of low spirits or “blues." Discuss your feelings with your partner, family and friends. This may be caused by the changing hormone levels in your body. You may want to contact your caregiver if this is worrisome. °SEEK MEDICAL CARE IF: °· There is swelling, redness or increasing pain in the wound area.  °· Pus is coming from the wound.  °· You notice a bad smell from the wound or surgical dressing.  °· You have pain, redness and swelling from the intravenous site.  °· The wound is breaking open (the edges are not staying together).  °· You feel dizzy or feel like fainting.  °· You develop pain or bleeding when you urinate.  °· You develop diarrhea.  °· You develop nausea and vomiting.  °· You develop abnormal vaginal discharge.  °· You develop a rash.  °· You have any type of abnormal reaction or develop an allergy to your medication.  °· You need stronger pain medication for your pain.  °SEEK IMMEDIATE MEDICAL CARE: °· You develop a temperature of 101 or higher.  °· You develop abdominal pain.  °· You develop chest pain.  °· You develop shortness of breath.  °· You pass out.  °· You develop pain, swelling or redness of your leg.  °· You develop heavy vaginal bleeding with or without blood clots.  °Document Released: 08/03/2002 Document Re-Released: 05/01/2010 °Exit[employer]® Patient Information ©2011 [employer]. °

## 2014-04-25 IMAGING — US US OB DETAIL+14 WK
1 series · 12 of 28 positions shown · non-contrast
Comparison: none

[Series 1: us ob detail+14 wk · 0.25mm/px · 12 of 74 slices shown]
[im 3/74]
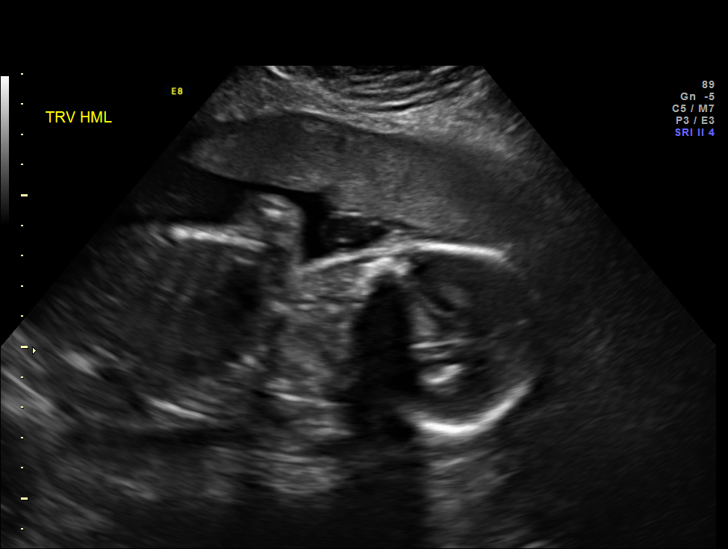
[im 9/74]
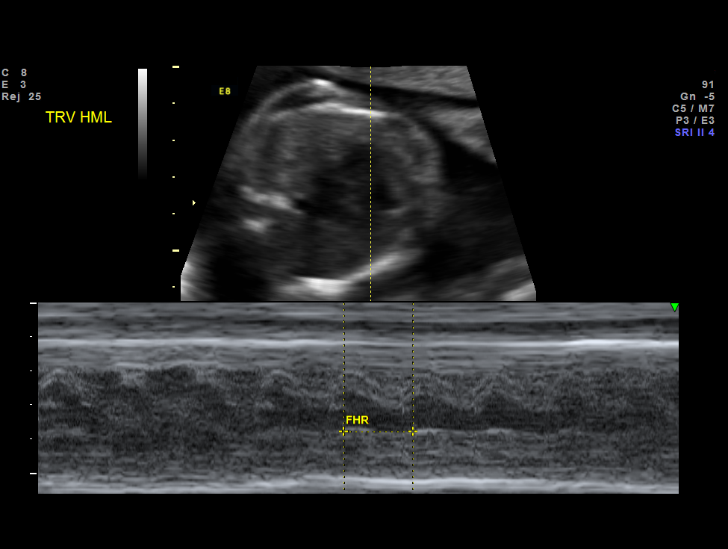
[im 14/74]
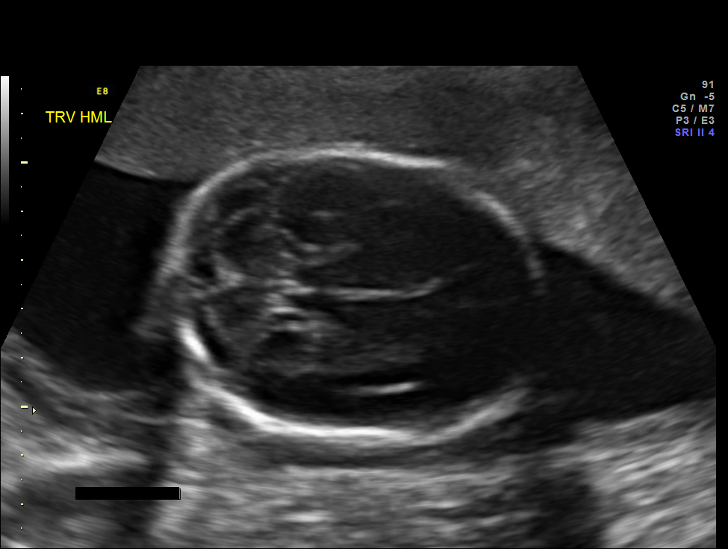
[im 22/74]
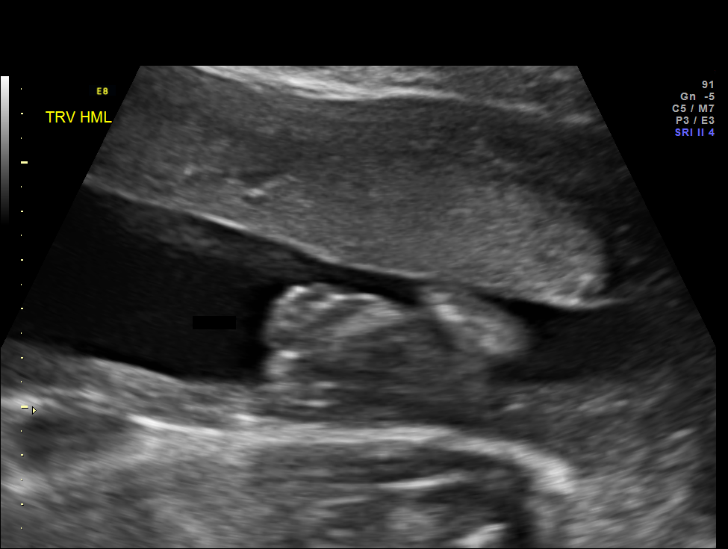
[im 28/74]
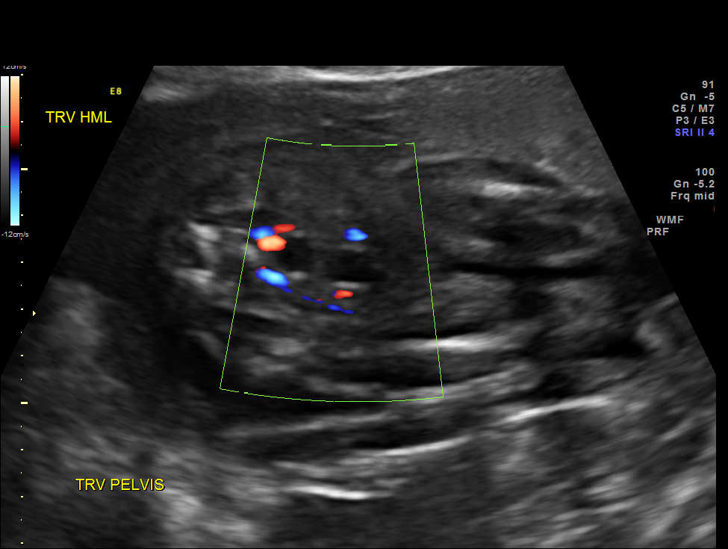
[im 33/74]
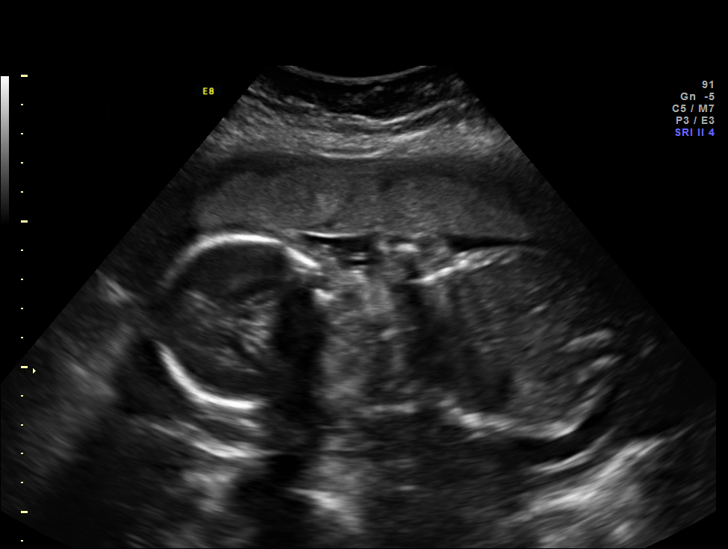
[im 41/74]
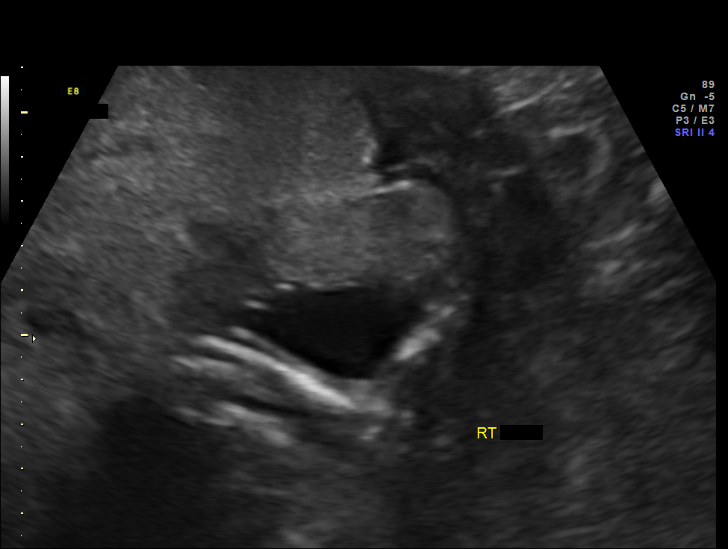
[im 46/74]
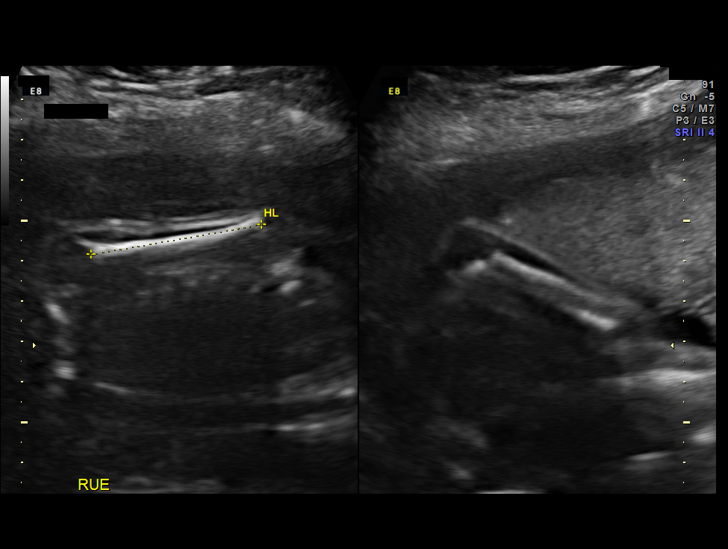
[im 52/74]
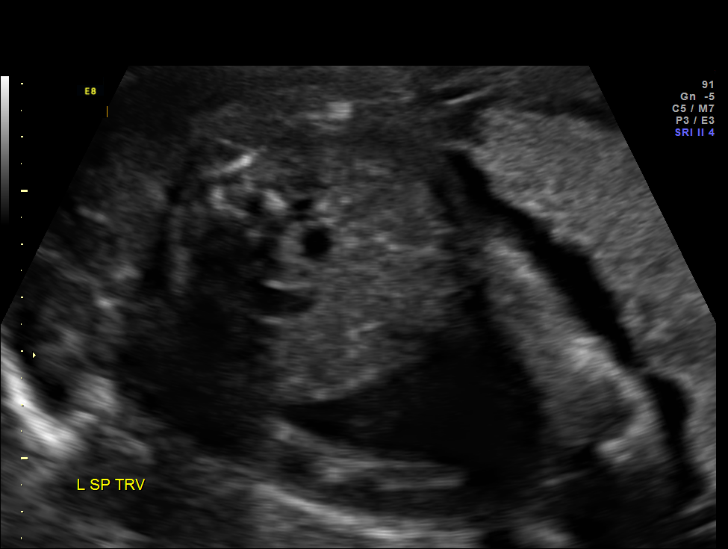
[im 60/74]
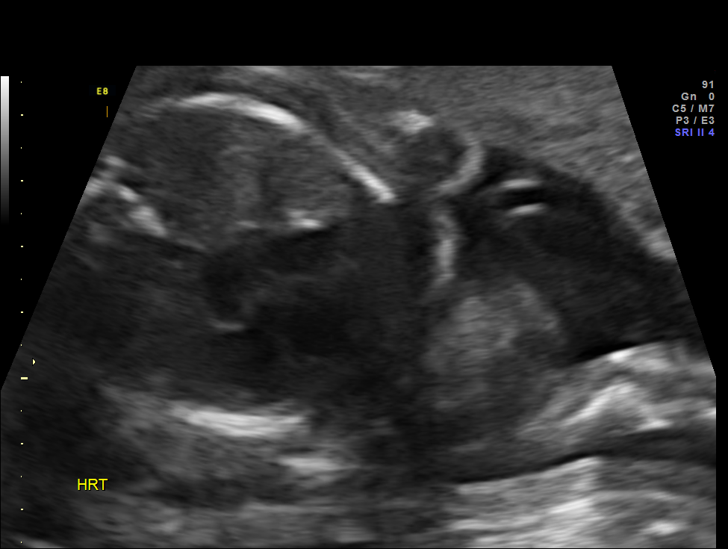
[im 65/74]
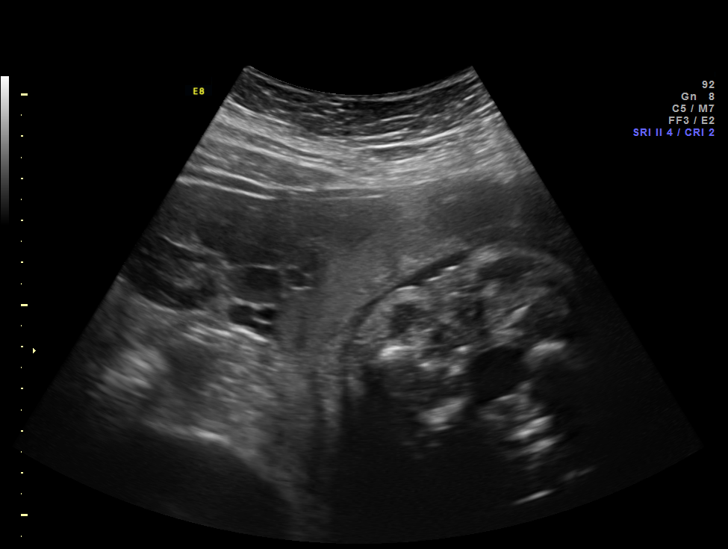
[im 71/74]
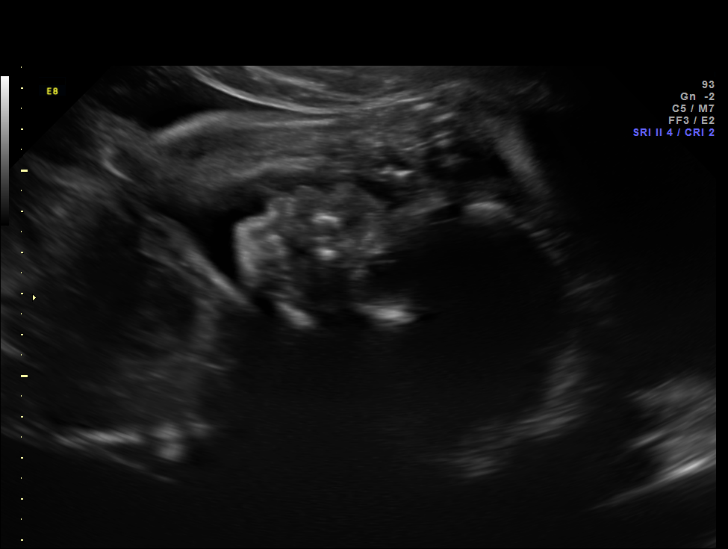

[12 of 28 positions shown; findings below may reference images not displayed]

OBSTETRICS REPORT
                      (Signed Final 09/16/2013 [DATE])

Service(s) Provided

 US OB DETAIL + 14 WK                                  76811.0
Indications

 Detailed fetal anatomic survey
 Advanced maternal age (37), Multigravida
 Poor obstetric history: Previous midtrimester loss
 (14 week demise)
Fetal Evaluation

 Num Of Fetuses:    1
 Fetal Heart Rate:  145                          bpm
 Cardiac Activity:  Observed
 Presentation:      Variable
 Placenta:          Anterior, above cervical os
 P. Cord            Visualized, central
 Insertion:

 Amniotic Fluid
 AFI FV:      Subjectively within normal limits
                                             Larg Pckt:     5.1  cm
Biometry

 BPD:     58.9  mm     G. Age:  24w 0d                CI:         73.4   70 - 86
 OFD:     80.2  mm                                    FL/HC:      21.7   18.7 -

 HC:     223.7  mm     G. Age:  24w 3d       10  %    HC/AC:      1.04   1.04 -

 AC:     214.4  mm     G. Age:  26w 0d       66  %    FL/BPD:     82.5   71 - 87
 FL:      48.6  mm     G. Age:  26w 2d       73  %    FL/AC:      22.7   20 - 24
 HUM:     42.8  mm     G. Age:  25w 4d       56  %
 CER:     27.9  mm     G. Age:  25w 0d       46  %

 Est. FW:     856  gm    1 lb 14 oz      66  %
Gestational Age

 LMP:           25w 1d        Date:  03/24/13                 EDD:   12/29/13
 U/S Today:     25w 1d                                        EDD:   12/29/13
 Best:          25w 1d     Det. By:  LMP  (03/24/13)          EDD:   12/29/13
Anatomy

 Cranium:          Appears normal         Aortic Arch:      Appears normal
 Fetal Cavum:      Appears normal         Ductal Arch:      Appears normal
 Ventricles:       Appears normal         Diaphragm:        Appears normal
 Choroid Plexus:   Appears normal         Stomach:          Appears normal
 Cerebellum:       Appears normal         Abdomen:          Appears normal
 Posterior Fossa:  Appears normal         Abdominal Wall:   Appears nml (cord
                                                            insert, abd wall)
 Nuchal Fold:      Not applicable (>20    Cord Vessels:     Appears normal (3
                   wks GA)                                  vessel cord)
 Face:             Appears normal         Kidneys:          Appear normal
                   (orbits and profile)
 Lips:             Appears normal         Bladder:          Appears normal
 Heart:            Appears normal         Spine:            Appears normal
                   (4CH, axis, and
                   situs)
 RVOT:             Appears normal         Lower             Visualized
                                          Extremities:
 LVOT:             Appears normal         Upper             Visualized
                                          Extremities:

 Other:  Fetus appears to be a female. Heels visualized. Technically difficult
         due to fetal position.
Targeted Anatomy

 Fetal Central Nervous System
 Cisterna Magna:
Cervix Uterus Adnexa

 Cervical Length:    3.8      cm

 Cervix:       Normal appearance by transabdominal scan.

 Left Ovary:    Within normal limits.
 Right Ovary:   Within normal limits.
 Adnexa:     No abnormality visualized.
Impression

 IUP at 25+1 weeks
 Normal detailed fetal anatomy
 Normal amniotic fluid volume
 Measurements consistent with LMP dating; EFW at the 66th
 %tile

 After genetic counseling, she declined all testing for
 aneuploidy.
Recommendations

 Follow-up ultrasound for growth in 4-6 weeks

 questions or concerns.

## 2014-09-26 ENCOUNTER — Encounter (HOSPITAL_COMMUNITY): Payer: Self-pay | Admitting: Obstetrics

## 2015-05-08 ENCOUNTER — Encounter (HOSPITAL_COMMUNITY): Payer: Self-pay | Admitting: Emergency Medicine

## 2015-05-08 ENCOUNTER — Emergency Department (HOSPITAL_BASED_OUTPATIENT_CLINIC_OR_DEPARTMENT_OTHER): Admit: 2015-05-08 | Discharge: 2015-05-08 | Disposition: A | Discharge status: Home / Self Care | Payer: Self-pay

## 2015-05-08 ENCOUNTER — Emergency Department (HOSPITAL_COMMUNITY): Admit: 2015-05-08 | Payer: Self-pay

## 2015-05-08 ENCOUNTER — Emergency Department (HOSPITAL_COMMUNITY)
Admission: EM | Admit: 2015-05-08 | Discharge: 2015-05-08 | Disposition: A | Discharge status: Home / Self Care | Payer: Medicaid Other | Attending: Emergency Medicine | Admitting: Emergency Medicine

## 2015-05-08 DIAGNOSIS — Z9851 Tubal ligation status: Secondary | ICD-10-CM | POA: Insufficient documentation

## 2015-05-08 DIAGNOSIS — Z9989 Dependence on other enabling machines and devices: Secondary | ICD-10-CM | POA: Insufficient documentation

## 2015-05-08 DIAGNOSIS — R252 Cramp and spasm: Secondary | ICD-10-CM | POA: Insufficient documentation

## 2015-05-08 DIAGNOSIS — Z8619 Personal history of other infectious and parasitic diseases: Secondary | ICD-10-CM | POA: Insufficient documentation

## 2015-05-08 DIAGNOSIS — Z87891 Personal history of nicotine dependence: Secondary | ICD-10-CM | POA: Insufficient documentation

## 2015-05-08 DIAGNOSIS — M7989 Other specified soft tissue disorders: Secondary | ICD-10-CM

## 2015-05-08 DIAGNOSIS — F329 Major depressive disorder, single episode, unspecified: Secondary | ICD-10-CM | POA: Insufficient documentation

## 2015-05-08 DIAGNOSIS — Z8742 Personal history of other diseases of the female genital tract: Secondary | ICD-10-CM | POA: Insufficient documentation

## 2015-05-08 DIAGNOSIS — M79604 Pain in right leg: Secondary | ICD-10-CM | POA: Insufficient documentation

## 2015-05-08 MED ORDER — IBUPROFEN 600 MG PO TABS: 600.0000 mg | ORAL_TABLET | Freq: Four times a day (QID) | ORAL | Status: DC | PRN

## 2015-05-08 MED ORDER — TRAMADOL HCL 50 MG PO TABS: 50.0000 mg | ORAL_TABLET | Freq: Four times a day (QID) | ORAL | Status: DC | PRN

## 2015-05-08 NOTE — ED Notes (Signed)
Pt. Stated, Ive had rt. Leg pain for 2 days with some swelling.

## 2015-05-08 NOTE — ED Provider Notes (Signed)
CSN: [id number]     Arrival date & time 05/08/15  1445 History  This chart was scribed for non-physician practitioner, Junius Finner, PA-C working with Tilden Fossa, MD by Doreatha Martin, ED scribe. This patient was seen in room TR05C/TR05C and the patient's care was started at 3:13 PM    Chief Complaint  Patient presents with  . Leg Pain   The history is provided by the patient. No language interpreter was used.    HPI Comments: [name] is a 39 y.o. female with no chronic medical history who presents to the Emergency Department complaining of moderate, gradual onset right leg pain onset 2 days ago. She states associated intermittent swelling onset one week. No OTC medications tried PTA. NKDA. She is a current occasional smoker. Pt states she is unsure of any FHx of PE/DVT because she was adopted. She denies oral contraceptive use, recent falls, injuries, recent long car rides and Hx of PE/DVT. She also denies numbness and fevers.   Past Medical History  Diagnosis Date  . Depression   . PID (acute pelvic inflammatory disease)   . Gonorrhea    Past Surgical History  Procedure Laterality Date  . Breast surgery    . Induced abortion    . Cesarean section      X 3  . Cesarean section with bilateral tubal ligation Bilateral 12/30/2013    Procedure: CESAREAN SECTION WITH BILATERAL TUBAL LIGATION;  Surgeon: Kathreen Cosier, MD;  Location: [facility];  Service: Obstetrics;  Laterality: Bilateral;   Family History  Problem Relation Age of Onset  . Alcohol abuse Neg Hx   . Arthritis Neg Hx   . Asthma Neg Hx   . Birth defects Neg Hx   . Cancer Neg Hx   . COPD Neg Hx   . Depression Neg Hx   . Diabetes Neg Hx   . Drug abuse Neg Hx   . Early death Neg Hx   . Hearing loss Neg Hx   . Heart disease Neg Hx   . Hyperlipidemia Neg Hx   . Hypertension Neg Hx   . Kidney disease Neg Hx   . Learning disabilities Neg Hx   . Mental illness Neg Hx   . Mental retardation Neg Hx   . Miscarriages  / Stillbirths Neg Hx   . Stroke Neg Hx   . Vision loss Neg Hx   . Varicose Veins Neg Hx    History  Substance Use Topics  . Smoking status: Former Smoker -- 0.25 packs/day    Types: Cigarettes  . Smokeless tobacco: Not on file  . Alcohol Use: No   OB History    Gravida Para Term Preterm AB TAB SAB Ectopic Multiple Living   6 4 4  2 2  0   4     Review of Systems  Constitutional: Negative for fever.  Musculoskeletal: Positive for arthralgias.  Neurological: Negative for numbness.  All other systems reviewed and are negative.  Allergies  Review of patient's allergies indicates no known allergies.  Home Medications   Prior to Admission medications   Medication Sig Start Date End Date Taking? Authorizing Provider  ibuprofen (ADVIL,MOTRIN) 600 MG tablet Take 1 tablet (600 mg total) by mouth every 6 (six) hours as needed. 05/08/15   Junius Finner, PA-C  oxyCODONE-acetaminophen (PERCOCET/ROXICET) 5-325 MG per tablet Take 1-2 tablets by mouth every 4 (four) hours as needed for severe pain (moderate - severe pain). 01/01/14   Antionette Char, MD  traMADol (ULTRAM) 50 MG tablet Take 1 tablet (50 mg total) by mouth every 6 (six) hours as needed. 05/08/15   Junius Finner, PA-C   BP 132/81 mmHg  Pulse 90  Temp(Src) 99 F (37.2 C) (Oral)  Resp 18  Ht  (1.676 m)  Wt 196 lb (88.905 kg)  BMI 31.65 kg/m2  SpO2 100%  LMP 04/27/2015 Physical Exam  Constitutional: She is oriented to person, place, and time. She appears well-developed and well-nourished.  HENT:  Head: Normocephalic and atraumatic.  Eyes: EOM are normal. Pupils are equal, round, and reactive to light.  Neck: Normal range of motion.  Cardiovascular: Normal rate, regular rhythm and normal heart sounds.   Pulses:      Dorsalis pedis pulses are 2+ on the right side, and 2+ on the left side.  Pulmonary/Chest: Effort normal and breath sounds normal. No respiratory distress.  Abdominal: Soft. There is no tenderness.   Musculoskeletal: Normal range of motion. She exhibits tenderness.  FROM of the right leg. No obvious deformity. Tenderness to posterior calf without ecchymosis or erythema. Compartment is soft. No significant swelling compared to left leg. 5/5 strength bilaterally in lower extremities.   Neurological: She is alert and oriented to person, place, and time.  Skin: Skin is warm and dry.  Psychiatric: She has a normal mood and affect. Her behavior is normal.  Nursing note and vitals reviewed.  ED Course  Procedures  DIAGNOSTIC STUDIES: Oxygen Saturation is 98% on RA, normal by my interpretation.    COORDINATION OF CARE: 3:15 PM Discussed treatment plan with pt at bedside and pt agreed to plan.   Labs Review Labs Reviewed - No data to display  Imaging Review No results found.   EKG Interpretation None     MDM   Final diagnoses:  Right leg pain  Cramps of right lower extremity    Pt is a 39yo female c/o Right lower leg pain and swelling for 2 days. No hx of injury. No evidence of underlying infection.  Pt is low risk for DVT. Calf is tender w/o obvious edema, ecchymosis or erythema. Will get Venous U/S to r/o DVT.  U/S: negative for DVT.   Will tx as muscle cramps.  Rx: tramadol and ibuprofen. Advised to f/u with PCP. Return precautions provided. Pt verbalized understanding and agreement with tx plan.   I personally performed the services described in this documentation, which was scribed in my presence. The recorded information has been reviewed and is accurate.    Junius Finner, PA-C 05/08/15 2004  Tilden Fossa, MD 05/09/15 [phone]

## 2015-05-08 NOTE — ED Notes (Signed)
Pt transported to vascular.  °

## 2015-05-08 NOTE — Progress Notes (Signed)
*  Preliminary Results* Right lower extremity venous duplex completed. Right lower extremity is negative for deep vein thrombosis. There is no evidence of right Baker's cyst.  05/08/2015 4:53 PM  Gertie Fey, RVT, RDCS, RDMS

## 2015-05-08 NOTE — ED Notes (Signed)
Pt returned back to room from vascular. PA at bedside.

## 2015-05-08 NOTE — ED Notes (Signed)
Pt verbalizes understanding of d/c instructions and denies any further needs at this time. 

## 2015-06-02 ENCOUNTER — Encounter (HOSPITAL_COMMUNITY): Payer: Self-pay | Admitting: Emergency Medicine

## 2015-06-02 ENCOUNTER — Inpatient Hospital Stay (HOSPITAL_COMMUNITY)
Admission: EM | Admit: 2015-06-02 | Discharge: 2015-06-06 | DRG: 585 | Disposition: A | Discharge status: Home Health | Payer: Self-pay | Attending: General Surgery | Admitting: General Surgery

## 2015-06-02 ENCOUNTER — Emergency Department (HOSPITAL_COMMUNITY)
Admission: EM | Admit: 2015-06-02 | Discharge: 2015-06-02 | Discharge status: Against Medical Advice | Payer: Medicaid Other | Attending: Emergency Medicine | Admitting: Emergency Medicine

## 2015-06-02 DIAGNOSIS — N61 Inflammatory disorders of breast: Principal | ICD-10-CM | POA: Diagnosis present

## 2015-06-02 DIAGNOSIS — Z9889 Other specified postprocedural states: Secondary | ICD-10-CM | POA: Insufficient documentation

## 2015-06-02 DIAGNOSIS — Z8619 Personal history of other infectious and parasitic diseases: Secondary | ICD-10-CM | POA: Insufficient documentation

## 2015-06-02 DIAGNOSIS — F329 Major depressive disorder, single episode, unspecified: Secondary | ICD-10-CM | POA: Insufficient documentation

## 2015-06-02 DIAGNOSIS — L0291 Cutaneous abscess, unspecified: Secondary | ICD-10-CM

## 2015-06-02 DIAGNOSIS — Z87891 Personal history of nicotine dependence: Secondary | ICD-10-CM

## 2015-06-02 DIAGNOSIS — Z6831 Body mass index (BMI) 31.0-31.9, adult: Secondary | ICD-10-CM

## 2015-06-02 DIAGNOSIS — N611 Abscess of the breast and nipple: Secondary | ICD-10-CM

## 2015-06-02 LAB — CBC WITH DIFFERENTIAL/PLATELET
Basophils Absolute: 0 10*3/uL (ref 0.0–0.1)
Basophils Relative: 0 % (ref 0–1)
Eosinophils Absolute: 0.1 10*3/uL (ref 0.0–0.7)
Eosinophils Relative: 1 % (ref 0–5)
HCT: 38.8 % (ref 36.0–46.0)
Hemoglobin: 12.9 g/dL (ref 12.0–15.0)
Lymphocytes Relative: 24 % (ref 12–46)
Lymphs Abs: 3.3 10*3/uL (ref 0.7–4.0)
MCH: 28.1 pg (ref 26.0–34.0)
MCHC: 33.2 g/dL (ref 30.0–36.0)
MCV: 84.5 fL (ref 78.0–100.0)
Monocytes Absolute: 1 10*3/uL (ref 0.1–1.0)
Monocytes Relative: 7 % (ref 3–12)
Neutro Abs: 9.7 10*3/uL — ABNORMAL HIGH (ref 1.7–7.7)
Neutrophils Relative %: 68 % (ref 43–77)
Platelets: 453 10*3/uL — ABNORMAL HIGH (ref 150–400)
RBC: 4.59 MIL/uL (ref 3.87–5.11)
RDW: 14.9 % (ref 11.5–15.5)
WBC: 14.1 10*3/uL — ABNORMAL HIGH (ref 4.0–10.5)

## 2015-06-02 LAB — COMPREHENSIVE METABOLIC PANEL
ALT: 29 U/L (ref 14–54)
AST: 30 U/L (ref 15–41)
Albumin: 3.8 g/dL (ref 3.5–5.0)
Alkaline Phosphatase: 101 U/L (ref 38–126)
Anion gap: 9 (ref 5–15)
BUN: 8 mg/dL (ref 6–20)
CO2: 24 mmol/L (ref 22–32)
Calcium: 9.1 mg/dL (ref 8.9–10.3)
Chloride: 104 mmol/L (ref 101–111)
Creatinine, Ser: 0.97 mg/dL (ref 0.44–1.00)
GFR calc Af Amer: >60 mL/min (ref >60)
GFR calc non Af Amer: >60 mL/min (ref >60)
Glucose, Bld: 97 mg/dL (ref 65–99)
Potassium: 4 mmol/L (ref 3.5–5.1)
Sodium: 137 mmol/L (ref 135–145)
Total Bilirubin: 0.6 mg/dL (ref 0.3–1.2)
Total Protein: 7.6 g/dL (ref 6.5–8.1)

## 2015-06-02 MED ORDER — SODIUM CHLORIDE 0.9 % IV SOLN
INTRAVENOUS | Status: DC
  Administered 2015-06-02 – 2015-06-04 (×3): via INTRAVENOUS

## 2015-06-02 MED ORDER — LIDOCAINE HCL (PF) 1 % IJ SOLN
30.0000 mL | Freq: Once | INTRAMUSCULAR | Status: DC
  Filled 2015-06-02: qty 30

## 2015-06-02 MED ORDER — POLYETHYLENE GLYCOL 3350 17 G PO PACK
17.0000 g | PACK | Freq: Every day | ORAL | Status: DC | PRN
  Filled 2015-06-02: qty 1

## 2015-06-02 MED ORDER — OXYCODONE-ACETAMINOPHEN 5-325 MG PO TABS
1.0000 | ORAL_TABLET | Freq: Once | ORAL | Status: AC
  Administered 2015-06-02: 1 via ORAL
  Filled 2015-06-02: qty 1

## 2015-06-02 MED ORDER — ENOXAPARIN SODIUM 40 MG/0.4ML ~~LOC~~ SOLN
40.0000 mg | SUBCUTANEOUS | Status: DC
  Administered 2015-06-03 – 2015-06-05 (×3): 40 mg via SUBCUTANEOUS
  Filled 2015-06-02 (×3): qty 0.4

## 2015-06-02 MED ORDER — HYDROMORPHONE HCL 1 MG/ML IJ SOLN
0.5000 mg | INTRAMUSCULAR | Status: DC | PRN
  Administered 2015-06-02 – 2015-06-05 (×14): 1 mg via INTRAVENOUS
  Filled 2015-06-02 (×14): qty 1

## 2015-06-02 MED ORDER — HYDROXYZINE HCL 25 MG PO TABS: 25.0000 mg | ORAL_TABLET | Freq: Once | ORAL | Status: DC

## 2015-06-02 MED ORDER — ONDANSETRON HCL 4 MG/2ML IJ SOLN
4.0000 mg | Freq: Four times a day (QID) | INTRAMUSCULAR | Status: DC | PRN
  Administered 2015-06-03 (×3): 4 mg via INTRAVENOUS
  Filled 2015-06-02 (×2): qty 2

## 2015-06-02 MED ORDER — OXYCODONE-ACETAMINOPHEN 5-325 MG PO TABS
1.0000 | ORAL_TABLET | ORAL | Status: DC | PRN
  Administered 2015-06-02: 2 via ORAL
  Administered 2015-06-03 – 2015-06-04 (×5): 1 via ORAL
  Administered 2015-06-05: 2 via ORAL
  Administered 2015-06-05: 1 via ORAL
  Administered 2015-06-05 – 2015-06-06 (×3): 2 via ORAL
  Filled 2015-06-02: qty 1
  Filled 2015-06-02: qty 2
  Filled 2015-06-02: qty 1
  Filled 2015-06-02 (×2): qty 2
  Filled 2015-06-02: qty 1
  Filled 2015-06-02: qty 2
  Filled 2015-06-02: qty 1
  Filled 2015-06-02 (×2): qty 2
  Filled 2015-06-02: qty 1

## 2015-06-02 MED ORDER — CEFAZOLIN SODIUM-DEXTROSE 2-3 GM-% IV SOLR
2.0000 g | Freq: Three times a day (TID) | INTRAVENOUS | Status: DC
  Administered 2015-06-02 – 2015-06-05 (×8): 2 g via INTRAVENOUS
  Filled 2015-06-02 (×11): qty 50

## 2015-06-02 NOTE — ED Notes (Signed)
Pt sts left sided breast abscess with hx of same and I/D in past

## 2015-06-02 NOTE — ED Notes (Signed)
Pt left AMA earlier b/c she had to drop her children off to be cared for, pt returned to be evaluated for her abscess on her left breast. Possibility of surgery needed on abscess, hx of the same. Blood work collected prior to pt AMA DC earlier.

## 2015-06-02 NOTE — ED Notes (Signed)
Pt reports she has no childcare for her children, pt unable to have family member pick up her children, pt reports she is leaving to drop her children off in a safe place and will be right back.

## 2015-06-02 NOTE — H&P (Signed)
[name] is an 39 y.o. female.   Chief Complaint: Left breast pain HPI: Worsening left breast pain with history of recurrent bilateral breast abscesses.  Last meal about 2 hours ago and patient drinking ice and water when entered the room.  Previously drained multiple time, but the only one I see in our system was in 2011 by Dr. Barry Kaufman, and that was of the right breast.  Past Medical History  Diagnosis Date  . Depression   . PID (acute pelvic inflammatory disease)   . Gonorrhea     Past Surgical History  Procedure Laterality Date  . Breast surgery    . Induced abortion    . Cesarean section      X 3  . Cesarean section with bilateral tubal ligation Bilateral 12/30/2013    Procedure: CESAREAN SECTION WITH BILATERAL TUBAL LIGATION;  Surgeon: Elizabeth Hamman, MD;  Location: [facility];  Service: Obstetrics;  Laterality: Bilateral;    Family History  Problem Relation Age of Onset  . Alcohol abuse Neg Hx   . Arthritis Neg Hx   . Asthma Neg Hx   . Birth defects Neg Hx   . Cancer Neg Hx   . COPD Neg Hx   . Depression Neg Hx   . Diabetes Neg Hx   . Drug abuse Neg Hx   . Early death Neg Hx   . Hearing loss Neg Hx   . Heart disease Neg Hx   . Hyperlipidemia Neg Hx   . Hypertension Neg Hx   . Kidney disease Neg Hx   . Learning disabilities Neg Hx   . Mental illness Neg Hx   . Mental retardation Neg Hx   . Miscarriages / Stillbirths Neg Hx   . Stroke Neg Hx   . Vision loss Neg Hx   . Varicose Veins Neg Hx    Social History:  reports that she has quit smoking. Her smoking use included Cigarettes. She smoked 0.25 packs per day. She does not have any smokeless tobacco history on file. She reports that she uses illicit drugs (Marijuana). She reports that she does not drink alcohol.  Allergies: No Known Allergies   (Not in a hospital admission)  Results for orders placed or performed during the hospital encounter of 06/02/15 (from the past 48 hour(s))  CBC with Differential      Status: Abnormal   Collection Time: 06/02/15  3:38 PM  Result Value Ref Range   WBC 14.1 (H) 4.0 - 10.5 K/uL   RBC 4.59 3.87 - 5.11 MIL/uL   Hemoglobin 12.9 12.0 - 15.0 g/dL   HCT 38.8 36.0 - 46.0 %   MCV 84.5 78.0 - 100.0 fL   MCH 28.1 26.0 - 34.0 pg   MCHC 33.2 30.0 - 36.0 g/dL   RDW 14.9 11.5 - 15.5 %   Platelets 453 (H) 150 - 400 K/uL   Neutrophils Relative % 68 43 - 77 %   Neutro Abs 9.7 (H) 1.7 - 7.7 K/uL   Lymphocytes Relative 24 12 - 46 %   Lymphs Abs 3.3 0.7 - 4.0 K/uL   Monocytes Relative 7 3 - 12 %   Monocytes Absolute 1.0 0.1 - 1.0 K/uL   Eosinophils Relative 1 0 - 5 %   Eosinophils Absolute 0.1 0.0 - 0.7 K/uL   Basophils Relative 0 0 - 1 %   Basophils Absolute 0.0 0.0 - 0.1 K/uL  Comprehensive metabolic panel     Status: None  Collection Time: 06/02/15  3:38 PM  Result Value Ref Range   Sodium 137 135 - 145 mmol/L   Potassium 4.0 3.5 - 5.1 mmol/L   Chloride 104 101 - 111 mmol/L   CO2 24 22 - 32 mmol/L   Glucose, Bld 97 65 - 99 mg/dL   BUN 8 6 - 20 mg/dL   Creatinine, Ser 0.97 0.44 - 1.00 mg/dL   Calcium 9.1 8.9 - 10.3 mg/dL   Total Protein 7.6 6.5 - 8.1 g/dL   Albumin 3.8 3.5 - 5.0 g/dL   AST 30 15 - 41 U/L   ALT 29 14 - 54 U/L   Alkaline Phosphatase 101 38 - 126 U/L   Total Bilirubin 0.6 0.3 - 1.2 mg/dL   GFR calc non Af Amer >60 >60 mL/min   GFR calc Af Amer >60 >60 mL/min    Comment: (NOTE) The eGFR has been calculated using the CKD EPI equation. This calculation has not been validated in all clinical situations. eGFR's persistently <60 mL/min signify possible Chronic Kidney Disease.    Anion gap 9 5 - 15   No results found.  Review of Systems  Constitutional: Positive for chills. Negative for fever.  All other systems reviewed and are negative.   Blood pressure 111/79, pulse 88, temperature 98.1 F (36.7 C), temperature source Oral, resp. rate 18, height _0  (1.676 m), weight 88.905 kg (196 lb), last menstrual period 06/02/2015, SpO2 99 %,  not currently breastfeeding. Physical Exam  Vitals reviewed. Constitutional: She is oriented to person, place, and time. She appears well-developed and well-nourished.  HENT:  Head: Normocephalic and atraumatic.  Eyes: Conjunctivae and EOM are normal. Pupils are equal, round, and reactive to light.  Neck: Normal range of motion. Neck supple.  Cardiovascular: Normal rate and regular rhythm.   Respiratory: Breath sounds normal.    GI: Soft. Bowel sounds are normal.  Musculoskeletal: Normal range of motion.  Neurological: She is alert and oriented to person, place, and time. She has normal reflexes.  Skin: Skin is warm and dry.  Psychiatric: She has a normal mood and affect.  Patient upset that she has to stay for this procedure.     Assessment/Plan Left breast abscess that needs to be adequately drained in the OR.  Patient ate not too long ago, and would be close to midnight before we could semi-electively perform her surgery.  Will put on tomorrow AM.  I have spoken with the surgeon on call tomorrow.  Will place on IV antibiotics tonight.  [name] 06/02/2015, 8:08 PM

## 2015-06-02 NOTE — ED Provider Notes (Signed)
CSN: [id number]     Arrival date & time 06/02/15  1733 History  This chart was scribed for non-physician practitioner, Trixie DredgeEmily Alayah Knouff, PA-C, working with Toy CookeyMegan Docherty, MD, by Budd PalmerVanessa Prueter ED Scribe. This patient was seen in room TR11C/TR11C and the patient's care was started at 6:40 PM    Chief Complaint  Patient presents with  . Abscess   The history is provided by the patient. No language interpreter was used.   HPI Comments: [name] is a 39 y.o. female who presents to the Emergency Department complaining of burning, constant, left breast pain onset this morning at 3:30am. She states that she breast feel warm to the touch. Pt reports a subjective fever, lightheadedness, sharp CP, and palpitations. She has a PMHx of abscesses in her left breast starting after her 39 y/o was born. Her most recent surgery was to remove an abscess in the left breast, 1 year ago. She has not taken any drugs for this.  She denies nipple discharge, n/v/d, abdominal pain. She is not currently breastfeeding. Her last meal was 3 hours ago.  Pt was seen in the ED earlier today, but left AMA because she had to pick up her children.   Past Medical History  Diagnosis Date  . Depression   . PID (acute pelvic inflammatory disease)   . Gonorrhea    Past Surgical History  Procedure Laterality Date  . Breast surgery    . Induced abortion    . Cesarean section      X 3  . Cesarean section with bilateral tubal ligation Bilateral 12/30/2013    Procedure: CESAREAN SECTION WITH BILATERAL TUBAL LIGATION;  Surgeon: Kathreen CosierBernard A Marshall, MD;  Location: [facility];  Service: Obstetrics;  Laterality: Bilateral;   Family History  Problem Relation Age of Onset  . Alcohol abuse Neg Hx   . Arthritis Neg Hx   . Asthma Neg Hx   . Birth defects Neg Hx   . Cancer Neg Hx   . COPD Neg Hx   . Depression Neg Hx   . Diabetes Neg Hx   . Drug abuse Neg Hx   . Early death Neg Hx   . Hearing loss Neg Hx   . Heart disease Neg Hx   .  Hyperlipidemia Neg Hx   . Hypertension Neg Hx   . Kidney disease Neg Hx   . Learning disabilities Neg Hx   . Mental illness Neg Hx   . Mental retardation Neg Hx   . Miscarriages / Stillbirths Neg Hx   . Stroke Neg Hx   . Vision loss Neg Hx   . Varicose Veins Neg Hx    History  Substance Use Topics  . Smoking status: Former Smoker -- 0.25 packs/day    Types: Cigarettes  . Smokeless tobacco: Not on file  . Alcohol Use: No   OB History    Gravida Para Term Preterm AB TAB SAB Ectopic Multiple Living   6 4 4  2 2  0   4     Review of Systems  Cardiovascular: Positive for chest pain and palpitations.  Gastrointestinal: Negative for nausea, vomiting, abdominal pain and diarrhea.  Musculoskeletal: Positive for myalgias.  Neurological: Positive for light-headedness.    Allergies  Review of patient's allergies indicates no known allergies.  Home Medications   Prior to Admission medications   Medication Sig Start Date End Date Taking? Authorizing Provider  ibuprofen (ADVIL,MOTRIN) 600 MG tablet Take 1 tablet (600 mg total) by  mouth every 6 (six) hours as needed. 05/08/15   Junius Finner, PA-C  oxyCODONE-acetaminophen (PERCOCET/ROXICET) 5-325 MG per tablet Take 1-2 tablets by mouth every 4 (four) hours as needed for severe pain (moderate - severe pain). 01/01/14   Antionette Char, MD  traMADol (ULTRAM) 50 MG tablet Take 1 tablet (50 mg total) by mouth every 6 (six) hours as needed. 05/08/15   Junius Finner, PA-C   BP 130/86 mmHg  Pulse 101  Temp(Src) 98.1 F (36.7 C) (Oral)  Resp 16  Ht  (1.676 m)  Wt 196 lb (88.905 kg)  BMI 31.65 kg/m2  SpO2 99%  LMP 06/02/2015  Breastfeeding? No Physical Exam  Constitutional: She appears well-developed and well-nourished. No distress.  HENT:  Head: Normocephalic and atraumatic.  Neck: Neck supple.  Pulmonary/Chest: Effort normal.  Left breast with area of induration, tenderness, overlying erythema, warmth.    Neurological: She is  alert.  Skin: She is not diaphoretic.  Nursing note and vitals reviewed.   ED Course  Procedures  DIAGNOSTIC STUDIES: Oxygen Saturation is 99% on RA, normal by my interpretation.    COORDINATION OF CARE: 6:42 PM - Discussed plans to consult with surgeon and order pain medications. Pt advised of plan for treatment and pt agrees.  Labs Review Labs Reviewed  CBC    Imaging Review No results found.   EKG Interpretation None      6:53 PM Discussed pt with Dr Micheline Maze.    7:01 PM Discussed pt with Dr Lindie Spruce who will see pt in ED.  MDM   Final diagnoses:  Breast abscess   Pt with left breast abscess with hx same with prior surgical intervention.  Please see ED note earlier today for further details, including lab workup.  Seen by Dr Lindie Spruce in the ED who admitted her for I&D in the OR.    I personally performed the services described in this documentation, which was scribed in my presence. The recorded information has been reviewed and is accurate.   Trixie Dredge, PA-C 06/03/15 0111  Toy Cookey, MD 06/03/15 [phone]

## 2015-06-02 NOTE — ED Provider Notes (Signed)
CSN: [id number]     Arrival date & time 06/02/15  1427 History  This chart was scribed for jeff Aronda Burford PA_C, working with No att. providers found by Elon Spanner, ED Scribe. This patient was seen in room TR11C/TR11C and the patient's care was started at 5:01 PM.   Chief Complaint  Patient presents with  . Abscess  . Breast Pain   The history is provided by the patient. No language interpreter was used.   HPI Comments: [name] is a 39 y.o. female who presents to the Emergency Department complaining of left breast pain described as burning onset 3:30 am today.  The patient reports she has had this complaint intermittently over the past several years and has required surgical intervention including full anesthesia on both breasts.  Patient is not breastfeeding.  She denies being pregnant due to history of tubal ligation. She denies fever, n/v/d, into the remainder of the breast, trauma to the breast, nipple discharge.  No history of breast cancer. Not currently taking oral contraceptives. No complications with previous surgeries. Last oral intake was approximately one hour prior to arrival.   Past Medical History  Diagnosis Date  . Depression   . PID (acute pelvic inflammatory disease)   . Gonorrhea    Past Surgical History  Procedure Laterality Date  . Breast surgery    . Induced abortion    . Cesarean section      X 3  . Cesarean section with bilateral tubal ligation Bilateral 12/30/2013    Procedure: CESAREAN SECTION WITH BILATERAL TUBAL LIGATION;  Surgeon: Kathreen Cosier, MD;  Location: [facility];  Service: Obstetrics;  Laterality: Bilateral;   Family History  Problem Relation Age of Onset  . Alcohol abuse Neg Hx   . Arthritis Neg Hx   . Asthma Neg Hx   . Birth defects Neg Hx   . Cancer Neg Hx   . COPD Neg Hx   . Depression Neg Hx   . Diabetes Neg Hx   . Drug abuse Neg Hx   . Early death Neg Hx   . Hearing loss Neg Hx   . Heart disease Neg Hx   . Hyperlipidemia Neg Hx    . Hypertension Neg Hx   . Kidney disease Neg Hx   . Learning disabilities Neg Hx   . Mental illness Neg Hx   . Mental retardation Neg Hx   . Miscarriages / Stillbirths Neg Hx   . Stroke Neg Hx   . Vision loss Neg Hx   . Varicose Veins Neg Hx    History  Substance Use Topics  . Smoking status: Former Smoker -- 0.25 packs/day    Types: Cigarettes  . Smokeless tobacco: Not on file  . Alcohol Use: No   OB History    Gravida Para Term Preterm AB TAB SAB Ectopic Multiple Living   0   4     Review of Systems  All other systems reviewed and are negative.   Allergies  Review of patient's allergies indicates no known allergies.  Home Medications   Prior to Admission medications   Medication Sig Start Date End Date Taking? Authorizing Provider  ibuprofen (ADVIL,MOTRIN) 600 MG tablet Take 1 tablet (600 mg total) by mouth every 6 (six) hours as needed. 05/08/15   Junius Finner, PA-C  oxyCODONE-acetaminophen (PERCOCET/ROXICET) 5-325 MG per tablet Take 1-2 tablets by mouth every 4 (four) hours as needed for severe pain (moderate -  severe pain). 01/01/14   Antionette Char, MD  traMADol (ULTRAM) 50 MG tablet Take 1 tablet (50 mg total) by mouth every 6 (six) hours as needed. 05/08/15   Junius Finner, PA-C   BP 117/57 mmHg  Pulse 101  Temp(Src) 99.1 F (37.3 C)  Resp 16  SpO2 100%  LMP 04/27/2015    Physical Exam  Constitutional: She is oriented to person, place, and time. She appears well-developed and well-nourished. No distress.  HENT:  Head: Normocephalic and atraumatic.  Eyes: Conjunctivae and EOM are normal.  Neck: Neck supple. No tracheal deviation present.  Cardiovascular: Normal rate, regular rhythm, S1 normal, S2 normal and normal pulses.   Pulmonary/Chest: Effort normal. No respiratory distress.  Abdominal: Soft. She exhibits no distension and no mass. There is no tenderness. There is no rebound and no guarding.  Musculoskeletal: Normal range of motion.   Neurological: She is alert and oriented to person, place, and time.  Skin: Skin is warm and dry.  Cellulitis to the left periareolar/nipple region. Fluctuance noted. Previous surgical scars noted.   Psychiatric: She has a normal mood and affect. Her behavior is normal.  Nursing note and vitals reviewed.   ED Course  Procedures (including critical care time)  EMERGENCY DEPARTMENT US SOFT TISSUE INTERPRETATION "Study: Limited Ultrasound of the noted body part in comments below"  INDICATIONS: Soft tissue infection Multiple views of the body part are obtained with a multi-frequency linear probe  PERFORMED BY:  Myself  IMAGES ARCHIVED?: Yes  SIDE:Left  BODY PART:Other soft tisse (comment in note)  FINDINGS: Abcess present  LIMITATIONS:  Body Habitus  INTERPRETATION:  Abcess present  COMMENT:  Left breast   DIAGNOSTIC STUDIES: Oxygen Saturation is 100% on RA, normal by my interpretation.    COORDINATION OF CARE:  3:03 PM Will perform UTS.  Patient acknowledges and agrees with plan.    Labs Review Labs Reviewed  CBC WITH DIFFERENTIAL/PLATELET - Abnormal; Notable for the following:    WBC 14.1 (*)    Platelets 453 (*)    Neutro Abs 9.7 (*)    All other components within normal limits  COMPREHENSIVE METABOLIC PANEL    Imaging Review No results found.   EKG Interpretation None      MDM   Final diagnoses:  Abscess    Labs: CBC, BMP- significant for WBC of 14.1  Imaging: [place] of left breast- abscess noted  Therapeutics: None   Assessment/Plan: Patient presents with an abscess to her left breast. History of the same required surgical management. No systemic symptoms at this time. Patient was informed that surgery needed to be consult , she reported that she needed to leave to take her kids somewhere. I advised her not to leave, informed her that this was a surgical emergency. Patient reported she would not stay she needed take her kids and she would return  shortly. I instructed her that potentially leaving could cause serious complications including death or serious disabilty, patient verbalizes understanding. Patient signed out AGAINST MEDICAL ADVICE reporting that she would be "right back". She was competent and in sound mind when she made this decision. Nursing staff present at the time of discussion. Final attempt was made for Tuesday, she refused. I encouraged patient to return immediately to the emergency room she verbalized her agreement coming back.  Discharge Meds: None  I personally performed the services described in this documentation, which was scribed in my presence. The recorded information has been reviewed and is accurate.   Tinnie Gens  Devlyn Parish, PA-C 06/02/15 1706  Toy CookeyMegan Docherty, MD 06/03/15 [id number]

## 2015-06-03 ENCOUNTER — Observation Stay (HOSPITAL_COMMUNITY): Payer: Self-pay | Admitting: Certified Registered Nurse Anesthetist

## 2015-06-03 ENCOUNTER — Encounter (HOSPITAL_COMMUNITY): Payer: Self-pay

## 2015-06-03 ENCOUNTER — Encounter (HOSPITAL_COMMUNITY): Admission: EM | Disposition: A | Discharge status: Home Health | Payer: Self-pay | Source: Home / Self Care

## 2015-06-03 ENCOUNTER — Observation Stay (HOSPITAL_COMMUNITY): Payer: Medicaid Other | Admitting: Certified Registered Nurse Anesthetist

## 2015-06-03 HISTORY — PX: INCISION AND DRAINAGE ABSCESS: SHX5864

## 2015-06-03 LAB — SURGICAL PCR SCREEN
MRSA, PCR: NEGATIVE
STAPHYLOCOCCUS AUREUS: POSITIVE — AB

## 2015-06-03 LAB — CBC
HCT: 35.2 % — ABNORMAL LOW (ref 36.0–46.0)
HEMOGLOBIN: 11.4 g/dL — AB (ref 12.0–15.0)
MCH: 27.5 pg (ref 26.0–34.0)
MCHC: 32.4 g/dL (ref 30.0–36.0)
MCV: 85 fL (ref 78.0–100.0)
PLATELETS: 396 10*3/uL (ref 150–400)
RBC: 4.14 MIL/uL (ref 3.87–5.11)
RDW: 15 % (ref 11.5–15.5)
WBC: 12.2 10*3/uL — AB (ref 4.0–10.5)

## 2015-06-03 SURGERY — INCISION AND DRAINAGE, ABSCESS
Anesthesia: General | Site: Breast | Laterality: Left

## 2015-06-03 MED ORDER — MUPIROCIN 2 % EX OINT
1.0000 "application " | TOPICAL_OINTMENT | Freq: Two times a day (BID) | CUTANEOUS | Status: DC
  Administered 2015-06-03 – 2015-06-06 (×7): 1 via NASAL
  Filled 2015-06-03: qty 22

## 2015-06-03 MED ORDER — NEOSTIGMINE METHYLSULFATE 10 MG/10ML IV SOLN
INTRAVENOUS | Status: AC
  Filled 2015-06-03: qty 1

## 2015-06-03 MED ORDER — PROMETHAZINE HCL 25 MG/ML IJ SOLN: 6.2500 mg | INTRAMUSCULAR | Status: DC | PRN

## 2015-06-03 MED ORDER — PROPOFOL 10 MG/ML IV BOLUS
INTRAVENOUS | Status: DC | PRN
  Administered 2015-06-03: 200 mg via INTRAVENOUS

## 2015-06-03 MED ORDER — OXYCODONE HCL 5 MG PO TABS: 5.0000 mg | ORAL_TABLET | Freq: Once | ORAL | Status: DC | PRN

## 2015-06-03 MED ORDER — FENTANYL CITRATE (PF) 250 MCG/5ML IJ SOLN
INTRAMUSCULAR | Status: AC
  Filled 2015-06-03: qty 5

## 2015-06-03 MED ORDER — OXYCODONE HCL 5 MG/5ML PO SOLN: 5.0000 mg | Freq: Once | ORAL | Status: DC | PRN

## 2015-06-03 MED ORDER — LIDOCAINE HCL (CARDIAC) 20 MG/ML IV SOLN
INTRAVENOUS | Status: AC
  Filled 2015-06-03: qty 5

## 2015-06-03 MED ORDER — MIDAZOLAM HCL 2 MG/2ML IJ SOLN
INTRAMUSCULAR | Status: AC
  Filled 2015-06-03: qty 2

## 2015-06-03 MED ORDER — MIDAZOLAM HCL 5 MG/5ML IJ SOLN
INTRAMUSCULAR | Status: DC | PRN
  Administered 2015-06-03: 2 mg via INTRAVENOUS

## 2015-06-03 MED ORDER — DEXAMETHASONE SODIUM PHOSPHATE 4 MG/ML IJ SOLN
INTRAMUSCULAR | Status: DC | PRN
  Administered 2015-06-03: 8 mg via INTRAVENOUS

## 2015-06-03 MED ORDER — ROCURONIUM BROMIDE 50 MG/5ML IV SOLN
INTRAVENOUS | Status: AC
  Filled 2015-06-03: qty 1

## 2015-06-03 MED ORDER — KETOROLAC TROMETHAMINE 30 MG/ML IJ SOLN
INTRAMUSCULAR | Status: AC
  Administered 2015-06-03: 30 mg via INTRAVENOUS
  Filled 2015-06-03: qty 1

## 2015-06-03 MED ORDER — PHENYLEPHRINE HCL 10 MG/ML IJ SOLN
INTRAMUSCULAR | Status: DC | PRN
  Administered 2015-06-03 (×2): 80 ug via INTRAVENOUS

## 2015-06-03 MED ORDER — ONDANSETRON HCL 4 MG/2ML IJ SOLN
INTRAMUSCULAR | Status: AC
  Filled 2015-06-03: qty 2

## 2015-06-03 MED ORDER — KETOROLAC TROMETHAMINE 30 MG/ML IJ SOLN
30.0000 mg | Freq: Once | INTRAMUSCULAR | Status: AC | PRN
  Administered 2015-06-03: 30 mg via INTRAVENOUS

## 2015-06-03 MED ORDER — 0.9 % SODIUM CHLORIDE (POUR BTL) OPTIME
TOPICAL | Status: DC | PRN
  Administered 2015-06-03: 1000 mL

## 2015-06-03 MED ORDER — LIDOCAINE HCL (CARDIAC) 20 MG/ML IV SOLN
INTRAVENOUS | Status: DC | PRN
  Administered 2015-06-03: 60 mg via INTRAVENOUS

## 2015-06-03 MED ORDER — SUCCINYLCHOLINE CHLORIDE 20 MG/ML IJ SOLN
INTRAMUSCULAR | Status: AC
  Filled 2015-06-03: qty 1

## 2015-06-03 MED ORDER — FENTANYL CITRATE (PF) 100 MCG/2ML IJ SOLN
INTRAMUSCULAR | Status: DC | PRN
  Administered 2015-06-03 (×4): 50 ug via INTRAVENOUS

## 2015-06-03 MED ORDER — ARTIFICIAL TEARS OP OINT
TOPICAL_OINTMENT | OPHTHALMIC | Status: AC
  Filled 2015-06-03: qty 3.5

## 2015-06-03 MED ORDER — HYDROMORPHONE HCL 1 MG/ML IJ SOLN
0.2500 mg | INTRAMUSCULAR | Status: DC | PRN
  Administered 2015-06-03 (×2): 0.5 mg via INTRAVENOUS

## 2015-06-03 MED ORDER — GLYCOPYRROLATE 0.2 MG/ML IJ SOLN
INTRAMUSCULAR | Status: AC
  Filled 2015-06-03: qty 3

## 2015-06-03 MED ORDER — LACTATED RINGERS IV SOLN
INTRAVENOUS | Status: DC
  Administered 2015-06-03: 12:00:00 via INTRAVENOUS

## 2015-06-03 MED ORDER — CHLORHEXIDINE GLUCONATE CLOTH 2 % EX PADS
6.0000 | MEDICATED_PAD | Freq: Every day | CUTANEOUS | Status: DC
  Administered 2015-06-03 – 2015-06-04 (×2): 6 via TOPICAL

## 2015-06-03 MED ORDER — HYDROMORPHONE HCL 1 MG/ML IJ SOLN
INTRAMUSCULAR | Status: AC
  Administered 2015-06-03: 0.5 mg via INTRAVENOUS
  Filled 2015-06-03: qty 1

## 2015-06-03 SURGICAL SUPPLY — 27 items
BINDER BREAST XLRG (GAUZE/BANDAGES/DRESSINGS) ×3 IMPLANT
BNDG GAUZE ELAST 4 BULKY (GAUZE/BANDAGES/DRESSINGS) IMPLANT
CANISTER SUCTION 2500CC (MISCELLANEOUS) ×3 IMPLANT
COVER SURGICAL LIGHT HANDLE (MISCELLANEOUS) ×3 IMPLANT
DRAPE LAPAROSCOPIC ABDOMINAL (DRAPES) ×3 IMPLANT
DRAPE UTILITY XL STRL (DRAPES) ×6 IMPLANT
DRSG PAD ABDOMINAL 8X10 ST (GAUZE/BANDAGES/DRESSINGS) ×3 IMPLANT
ELECT CAUTERY BLADE 6.4 (BLADE) ×3 IMPLANT
ELECT REM PT RETURN 9FT ADLT (ELECTROSURGICAL) ×3
ELECTRODE REM PT RTRN 9FT ADLT (ELECTROSURGICAL) ×1 IMPLANT
GAUZE IODOFORM PACK 1/2 7832 (GAUZE/BANDAGES/DRESSINGS) ×3 IMPLANT
GAUZE SPONGE 4X4 12PLY STRL (GAUZE/BANDAGES/DRESSINGS) ×3 IMPLANT
GLOVE BIO SURGEON STRL SZ7.5 (GLOVE) ×3 IMPLANT
GLOVE BIOGEL PI IND STRL 8 (GLOVE) ×1 IMPLANT
GLOVE BIOGEL PI INDICATOR 8 (GLOVE) ×2
GOWN STRL REUS W/ TWL LRG LVL3 (GOWN DISPOSABLE) ×2 IMPLANT
GOWN STRL REUS W/TWL LRG LVL3 (GOWN DISPOSABLE) ×4
KIT BASIN OR (CUSTOM PROCEDURE TRAY) ×3 IMPLANT
KIT ROOM TURNOVER OR (KITS) ×3 IMPLANT
NS IRRIG 1000ML POUR BTL (IV SOLUTION) ×3 IMPLANT
PACK GENERAL/GYN (CUSTOM PROCEDURE TRAY) ×3 IMPLANT
PAD ARMBOARD 7.5X6 YLW CONV (MISCELLANEOUS) ×3 IMPLANT
SWAB COLLECTION DEVICE MRSA (MISCELLANEOUS) IMPLANT
TAPE CLOTH SURG 4X10 WHT LF (GAUZE/BANDAGES/DRESSINGS) ×3 IMPLANT
TOWEL OR 17X24 6PK STRL BLUE (TOWEL DISPOSABLE) ×3 IMPLANT
TOWEL OR 17X26 10 PK STRL BLUE (TOWEL DISPOSABLE) ×3 IMPLANT
TUBE ANAEROBIC SPECIMEN COL (MISCELLANEOUS) IMPLANT

## 2015-06-03 NOTE — Progress Notes (Signed)
  Subjective: Pt doing well  Objective: Vital signs in last 24 hours: Temp:  [97.4 F (36.3 C)-99.1 F (37.3 C)] 97.4 F (36.3 C) (07/09 0958) Pulse Rate:  [55-101] 71 (07/09 0958) Resp:  [16-19] 16 (07/09 0958) BP: (96-130)/(56-86) 98/56 mmHg (07/09 0958) SpO2:  [99 %-100 %] 100 % (07/09 0958) Weight:  [88.905 kg (196 lb)] 88.905 kg (196 lb) (07/08 1741) Last BM Date: 06/02/15  Intake/Output from previous day: 07/08 0701 - 07/09 0700 In: 665 [I.V.:615; IV Piggyback:50] Out: -  Intake/Output this shift: Total I/O In: 0  Out: 250 [Urine:250]  Breasts: L breast erythema and fluctuance  Lab Results:   Recent Labs  06/02/15 1538 06/03/15 0500  WBC 14.1* 12.2*  HGB 12.9 11.4*  HCT 38.8 35.2*  PLT 453* 396   BMET  Recent Labs  06/02/15 1538  NA 137  K 4.0  CL 104  CO2 24  GLUCOSE 97  BUN 8  CREATININE 0.97  CALCIUM 9.1    Anti-infectives: Anti-infectives    Start     Dose/Rate Route Frequency Ordered Stop   06/02/15 2200  ceFAZolin (ANCEF) IVPB 2 g/50 mL premix     2 g 100 mL/hr over 30 Minutes Intravenous 3 times per day 06/02/15 2006        Assessment/Plan: L breast abscess To OR today for I&D of abscess Abx       Elizabeth Ehlersamirez Jr., [name] 06/03/2015

## 2015-06-03 NOTE — Op Note (Addendum)
06/03/2015  1:08 PM  PATIENT:  [name]  39 y.o. female  PRE-OPERATIVE DIAGNOSIS:  Left breast abscess  POST-OPERATIVE DIAGNOSIS:  LEFT BREAST ABSCESS  PROCEDURE:  Procedure(s): INCISION AND DRAINAGE ABSCESS (Left)  SURGEON:  Surgeon(s) and Role:    * Axel FillerArmando Henry Demeritt, MD - Primary  ANESTHESIA:   general  EBL: 5cc Total I/O In: 0  Out: 250 [Urine:250]  BLOOD ADMINISTERED:none  DRAINS: 1.25" iodoform gauze packed into wound   LOCAL MEDICATIONS USED:  NONE  SPECIMEN:  Source of Specimen:  Micro-culture from left breast  DISPOSITION OF SPECIMEN:  Microbiology  COUNTS:  YES  TOURNIQUET:  * No tourniquets in log *  DICTATION: .Dragon Dictation After the patient was consented she was taken back to the operating room and placed in the supine position with bilateral SCDs in place. The patient was prepped and draped in usual sterile fashion. After appropriate antibiotic for confirmatory timeout was called all facts verified.   The area of purulence that was self treating was at approximately the 9:00 position of the left breast just outside the areola. An elliptical incision was made, curvilinear in this area. At this time blunt dissection was used to break up any loculations. Both anaerobic and aerobic cultures were obtained and sent to microbiology. The cavity tracked medial and deep. There is sterile saline. The wound was packed with 1/4 inch iodoform gauze. The wound was dressed with 4 x 4's, ABD pad.  Patient on the procedure well was taken to the recovery room in stable condition.  PLAN OF CARE: Admit to inpatient   PATIENT DISPOSITION:  PACU - hemodynamically stable.   Delay start of Pharmacological VTE agent (>24hrs) due to surgical blood loss or risk of bleeding: yes

## 2015-06-03 NOTE — Transfer of Care (Signed)
Immediate Anesthesia Transfer of Care Note  Patient: Elizabeth GuadalajaraFelicia S Borg  Procedure(s) Performed: Procedure(s): INCISION AND DRAINAGE ABSCESS (Left)  Patient Location: PACU  Anesthesia Type:General  Level of Consciousness: awake, alert , oriented and patient cooperative  Airway & Oxygen Therapy: Patient Spontanous Breathing and Patient connected to nasal cannula oxygen  Post-op Assessment: Report given to RN and Post -op Vital signs reviewed and stable  Post vital signs: Reviewed and stable  Last Vitals:  Filed Vitals:   06/03/15 0958  BP: 98/56  Pulse: 71  Temp: 36.3 C  Resp: 16    Complications: No apparent anesthesia complications

## 2015-06-03 NOTE — Anesthesia Postprocedure Evaluation (Signed)
  Anesthesia Post-op Note  Patient: [name]  Procedure(s) Performed: Procedure(s): INCISION AND DRAINAGE ABSCESS (Left)  Patient Location: PACU  Anesthesia Type:General  Level of Consciousness: awake, alert  and oriented  Airway and Oxygen Therapy: Patient Spontanous Breathing  Post-op Pain: mild  Post-op Assessment: Post-op Vital signs reviewed              Post-op Vital Signs: Reviewed  Last Vitals:  Filed Vitals:   06/03/15 1415  BP: 108/71  Pulse: 76  Temp: 36.6 C  Resp: 15    Complications: No apparent anesthesia complications

## 2015-06-03 NOTE — Anesthesia Preprocedure Evaluation (Addendum)
Anesthesia Evaluation  Patient identified by MRN, date of birth, ID band Patient awake    Reviewed: Allergy & Precautions, NPO status , Patient's Chart, lab work & pertinent test results  Airway Mallampati: II  TM Distance: >3 FB Neck ROM: Full    Dental   Pulmonary former smoker,  breath sounds clear to auscultation        Cardiovascular negative cardio ROS  Rhythm:Regular Rate:Normal     Neuro/Psych negative neurological ROS     GI/Hepatic negative GI ROS, Neg liver ROS,   Endo/Other  Morbid obesity  Renal/GU negative Renal ROS     Musculoskeletal   Abdominal   Peds  Hematology negative hematology ROS (+)   Anesthesia Other Findings   Reproductive/Obstetrics                            Anesthesia Physical Anesthesia Plan  ASA: II  Anesthesia Plan: General   Post-op Pain Management:    Induction: Intravenous  Airway Management Planned: LMA  Additional Equipment:   Intra-op Plan:   Post-operative Plan:   Informed Consent: I have reviewed the patients History and Physical, chart, labs and discussed the procedure including the risks, benefits and alternatives for the proposed anesthesia with the patient or authorized representative who has indicated his/her understanding and acceptance.   Dental advisory given  Plan Discussed with: CRNA  Anesthesia Plan Comments:         Anesthesia Quick Evaluation

## 2015-06-04 NOTE — Progress Notes (Signed)
Patient ID: [name], female   DOB: 1976-05-14, 39 y.o.   MRN: [id number] 1 Day Post-Op  Subjective: Pt feels better today, but c/o burning.  Wants to go home  Objective: Vital signs in last 24 hours: Temp:  [97.4 F (36.3 C)-98.6 F (37 C)] 98.6 F (37 C) (07/09 2048) Pulse Rate:  [59-76] 76 (07/09 2048) Resp:  [15-18] 18 (07/09 2048) BP: (98-112)/(56-71) 101/64 mmHg (07/09 2048) SpO2:  [98 %-100 %] 100 % (07/09 2048) Last BM Date: 06/02/15  Intake/Output from previous day: 07/09 0701 - 07/10 0700 In: 2916.3 [P.O.:360; I.V.:2456.3; IV Piggyback:100] Out: 565 [Urine:550; Blood:15] Intake/Output this shift: Total I/O In: -  Out: 900 [Urine:900]  PE: Skin: left breast wound is clean and much softer today.  No purulent drainage or further erythema  Lab Results:   Recent Labs  06/02/15 1538 06/03/15 0500  WBC 14.1* 12.2*  HGB 12.9 11.4*  HCT 38.8 35.2*  PLT 453* 396   BMET  Recent Labs  06/02/15 1538  NA 137  K 4.0  CL 104  CO2 24  GLUCOSE 97  BUN 8  CREATININE 0.97  CALCIUM 9.1   PT/INR No results for input(s): LABPROT, INR in the last 72 hours. CMP     Component Value Date/Time   NA 137 06/02/2015 1538   K 4.0 06/02/2015 1538   CL 104 06/02/2015 1538   CO2 24 06/02/2015 1538   GLUCOSE 97 06/02/2015 1538   BUN 8 06/02/2015 1538   CREATININE 0.97 06/02/2015 1538   CALCIUM 9.1 06/02/2015 1538   PROT 7.6 06/02/2015 1538   ALBUMIN 3.8 06/02/2015 1538   AST 30 06/02/2015 1538   ALT 29 06/02/2015 1538   ALKPHOS 101 06/02/2015 1538   BILITOT 0.6 06/02/2015 1538   GFRNONAA >60 06/02/2015 1538   GFRAA >60 06/02/2015 1538   Lipase  No results found for: LIPASE     Studies/Results: No results found.  Anti-infectives: Anti-infectives    Start     Dose/Rate Route Frequency Ordered Stop   06/02/15 2200  ceFAZolin (ANCEF) IVPB 2 g/50 mL premix     2 g 100 mL/hr over 30 Minutes Intravenous 3 times per day 06/02/15 2006          Assessment/Plan  POD 1, s/p I&D of left breast abscess, Dr. Derrell Lollingamirez  - begin dressing changes today BID.  Due to pain, she will need to stay until she can tolerate her dressing changes with oral pain meds only, suspect until tomorrow -SLIV -cont IV abx therapy, await cultures and convert to oral tomorrow -[facility]  DVT prophylaxis  -lovenox/SCDs  LOS: 1 day    OSBORNE,KELLY E 06/04/2015, 9:17 AM Pager: [phone]  Agree with above. Pain control will be an issue. She works at [employer].  Ovidio Kinavid Holston Oyama, MD, [facility] Pager: [phone] Office phone:  [phone]

## 2015-06-05 ENCOUNTER — Encounter (HOSPITAL_COMMUNITY): Payer: Self-pay | Admitting: General Surgery

## 2015-06-05 MED ORDER — TRAMADOL HCL 50 MG PO TABS
50.0000 mg | ORAL_TABLET | Freq: Four times a day (QID) | ORAL | Status: DC | PRN
  Administered 2015-06-05 (×2): 100 mg via ORAL
  Filled 2015-06-05 (×2): qty 2

## 2015-06-05 MED ORDER — NAPROXEN 500 MG PO TABS: 500.0000 mg | ORAL_TABLET | Freq: Two times a day (BID) | ORAL | Status: DC

## 2015-06-05 MED ORDER — OXYCODONE-ACETAMINOPHEN 5-325 MG PO TABS: 1.0000 | ORAL_TABLET | ORAL | Status: AC | PRN

## 2015-06-05 MED ORDER — CEPHALEXIN 500 MG PO CAPS: 500.0000 mg | ORAL_CAPSULE | Freq: Four times a day (QID) | ORAL | Status: AC

## 2015-06-05 MED ORDER — DOCUSATE SODIUM 100 MG PO CAPS: 100.0000 mg | ORAL_CAPSULE | Freq: Two times a day (BID) | ORAL | Status: AC | PRN

## 2015-06-05 MED ORDER — MUPIROCIN 2 % EX OINT: 1.0000 "application " | TOPICAL_OINTMENT | Freq: Two times a day (BID) | CUTANEOUS | Status: AC

## 2015-06-05 MED ORDER — CEPHALEXIN 500 MG PO CAPS
500.0000 mg | ORAL_CAPSULE | Freq: Four times a day (QID) | ORAL | Status: DC
  Administered 2015-06-05 – 2015-06-06 (×4): 500 mg via ORAL
  Filled 2015-06-05 (×4): qty 1

## 2015-06-05 MED ORDER — METHOCARBAMOL 500 MG PO TABS: 1000.0000 mg | ORAL_TABLET | Freq: Three times a day (TID) | ORAL | Status: DC | PRN

## 2015-06-05 MED ORDER — TRAMADOL HCL 50 MG PO TABS: 50.0000 mg | ORAL_TABLET | Freq: Four times a day (QID) | ORAL | Status: AC | PRN

## 2015-06-05 MED ORDER — POLYETHYLENE GLYCOL 3350 17 G PO PACK: 17.0000 g | PACK | Freq: Every day | ORAL | Status: AC | PRN

## 2015-06-05 MED ORDER — DOCUSATE SODIUM 100 MG PO CAPS
100.0000 mg | ORAL_CAPSULE | Freq: Two times a day (BID) | ORAL | Status: DC
  Administered 2015-06-05 – 2015-06-06 (×3): 100 mg via ORAL
  Filled 2015-06-05 (×3): qty 1

## 2015-06-05 MED ORDER — METHOCARBAMOL 500 MG PO TABS
1000.0000 mg | ORAL_TABLET | Freq: Three times a day (TID) | ORAL | Status: DC | PRN
  Administered 2015-06-05: 1000 mg via ORAL
  Filled 2015-06-05 (×2): qty 2

## 2015-06-05 MED ORDER — NAPROXEN 250 MG PO TABS
500.0000 mg | ORAL_TABLET | Freq: Two times a day (BID) | ORAL | Status: DC
  Administered 2015-06-05 – 2015-06-06 (×3): 500 mg via ORAL
  Filled 2015-06-05 (×2): qty 2

## 2015-06-05 NOTE — Progress Notes (Signed)
[facility] Progress Note  2 Days Post-Op  Subjective: Pt's pain improving, but c/o a lot of burning.  Got in shower and did well.  Wound is cleaning up nicely.  She's taken 3 showers since surgery.  Ambulating well.  Tolerating diet.  No BM yet.     Objective: Vital signs in last 24 hours: Temp:  [98.5 F (36.9 C)-98.6 F (37 C)] 98.6 F (37 C) (07/11 0516) Pulse Rate:  [61-78] 78 (07/11 0516) Resp:  [16-17] 16 (07/11 0516) BP: (97-122)/(63-89) 108/82 mmHg (07/11 0516) SpO2:  [96 %-100 %] 96 % (07/11 0516) Last BM Date: 06/02/15  Intake/Output from previous day: 07/10 0701 - 07/11 0700 In: 2099.5 [P.O.:922; I.V.:1027.5; IV Piggyback:150] Out: 1600 [Urine:1600] Intake/Output this shift: Total I/O In: 360 [P.O.:360] Out: 600 [Urine:600]  PE: Gen:  Alert, NAD, pleasant Left breast:  elliptical incision at 9 o'clock position of left breast.  Beefy red/pink tissue, no purulent drainage, no significant induration, no fluctuance, redness has improved.   Lab Results:   Recent Labs  06/02/15 1538 06/03/15 0500  WBC 14.1* 12.2*  HGB 12.9 11.4*  HCT 38.8 35.2*  PLT 453* 396   BMET  Recent Labs  06/02/15 1538  NA 137  K 4.0  CL 104  CO2 24  GLUCOSE 97  BUN 8  CREATININE 0.97  CALCIUM 9.1   PT/INR No results for input(s): LABPROT, INR in the last 72 hours. CMP     Component Value Date/Time   NA 137 06/02/2015 1538   K 4.0 06/02/2015 1538   CL 104 06/02/2015 1538   CO2 24 06/02/2015 1538   GLUCOSE 97 06/02/2015 1538   BUN 8 06/02/2015 1538   CREATININE 0.97 06/02/2015 1538   CALCIUM 9.1 06/02/2015 1538   PROT 7.6 06/02/2015 1538   ALBUMIN 3.8 06/02/2015 1538   AST 30 06/02/2015 1538   ALT 29 06/02/2015 1538   ALKPHOS 101 06/02/2015 1538   BILITOT 0.6 06/02/2015 1538   GFRNONAA >60 06/02/2015 1538   GFRAA >60 06/02/2015 1538   Lipase  No results found for: LIPASE     Studies/Results: No results  found.  Anti-infectives: Anti-infectives    Start     Dose/Rate Route Frequency Ordered Stop   06/02/15 2200  ceFAZolin (ANCEF) IVPB 2 g/50 mL premix     2 g 100 mL/hr over 30 Minutes Intravenous 3 times per day 06/02/15 2006         Assessment/Plan Recurrent b/l breast abscesses Left breast abscess POD #2 s/p I&D -Day #4 ancef, switch to keflex to finish 7 days of antibiotics -Dressing changes with WD gauze packing changes, tolerating them better  -Reg diet, bowel regimen -D/c IV pain medication.  Add robaxin, naproxen, ultram to help control pain with dressing changes -If she tolerates a dressing change this afternoon at 2-3pm then she can discharge home     LOS: 2 days    Nonie HoyerMegan N Briseyda Fehr 06/05/2015, 9:45 AM Pager: [phone]

## 2015-06-05 NOTE — Discharge Summary (Signed)
[facility] Discharge Summary   Patient ID: BARRETT HOLTHAUS MRN: [id number] DOB/AGE: 1976/10/08 39 y.o.  Admit date: 06/02/2015 Discharge date: 06/06/2015  Admitting Diagnosis: H/o recurrent breast abscesses Left breast abscess  Discharge Diagnosis Patient Active Problem List   Diagnosis Date Noted  . Left breast abscess 06/02/2015  . S/P cesarean section 12/30/2013    Consultants None  Imaging: No results found.  Procedures Dr. Derrell Lolling (06/03/15) - Incision and drainage of left breast abscess  Hospital Course:  39 y/o female who presented to [facility] with worsening left breast pain with history of recurrent bilateral breast abscesses. Last meal about 2 hours ago and patient drinking ice and water when entered the room. Previously drained multiple time, but the only one I see in our system was in 2011 by Dr. Donell Beers, and that was of the right breast.  Workup showed left breast abscess.  Patient was admitted and underwent procedure listed above.  Tolerated procedure well and was transferred to the floor.  Diet was advanced as tolerated.  WD dressing changes were begun along with showers.  On POD #3 the patient was voiding well, tolerating diet, ambulating well, pain well controlled, vital signs stable, tolerating dressing changes to left breast and felt stable for discharge home.  Will send home with Keflex PO to finish out a 7 day course of antibiotics.  [facility] RN for help with dressing changes has been set up.  Patient will follow up in our office in 2-3 weeks with Dr. Derrell Lolling and knows to call with questions or concerns.    Physical Exam: General:  Alert, NAD, pleasant, comfortable Left Breast:  9o'clock position wound is clean and packed.  No erythema or induration noted.  No purulent drainage    Medication List    TAKE these medications        cephALEXin 500 MG capsule  Commonly known as:  KEFLEX  Take 1 capsule (500 mg total) by mouth every 6 (six) hours.     docusate sodium 100 MG capsule  Commonly known as:  COLACE  Take 1 capsule (100 mg total) by mouth 2 (two) times daily as needed for mild constipation.     ibuprofen 600 MG tablet  Commonly known as:  ADVIL,MOTRIN  Take 1 tablet (600 mg total) by mouth every 6 (six) hours as needed.     methocarbamol 500 MG tablet  Commonly known as:  ROBAXIN  Take 2 tablets (1,000 mg total) by mouth every 8 (eight) hours as needed for muscle spasms (or pain).     mupirocin ointment 2 %  Commonly known as:  BACTROBAN  Place 1 application into the nose 2 (two) times daily. Take tube from room home     naproxen 500 MG tablet  Commonly known as:  NAPROSYN  Take 1 tablet (500 mg total) by mouth 2 (two) times daily with a meal.     oxyCODONE-acetaminophen 5-325 MG per tablet  Commonly known as:  PERCOCET/ROXICET  Take 1-2 tablets by mouth every 4 (four) hours as needed for severe pain (moderate - severe pain).     polyethylene glycol packet  Commonly known as:  MIRALAX / GLYCOLAX  Take 17 g by mouth daily as needed for mild constipation.     traMADol 50 MG tablet  Commonly known as:  ULTRAM  Take 1-2 tablets (50-100 mg total) by mouth every 6 (six) hours as needed for moderate pain or severe pain.  Follow-up Information    Follow up with Lajean Saveramirez Jr., Armando, MD. Schedule an appointment as soon as possible for a visit on 06/27/2015.   Specialty:  General Surgery   Why:  For post-operation check.  Appointment at 06/27/15 at 4:50pm, please arrive by 4:20pm to check in and fill out paperwork.   Contact information:   [address] [place] [id number] [phone]       Signed: Barnetta ChapelKelly Larie Mathes, PA-C Pager [phone]

## 2015-06-06 LAB — CULTURE, ROUTINE-ABSCESS

## 2015-06-06 NOTE — Discharge Instructions (Signed)
Stop taking ibuprofen at home while taking naproxen that was prescribed   Dressing Change two times a day A dressing is a material placed over wounds. It keeps the wound clean, dry, and protected from further injury. This provides an environment that favors wound healing.  BEFORE YOU BEGIN  Get your supplies together. Things you may need include:  Saline solution.  Flexible gauze dressing.  Medicated cream.  Tape.  Gloves.  Abdominal dressing pads.  Gauze squares.  Plastic bags.  Take pain medicine 30 minutes before the dressing change if you need it.  Take a shower before you do the first dressing change of the day. Use plastic wrap or a plastic bag to prevent the dressing from getting wet. REMOVING YOUR OLD DRESSING   Wash your hands with soap and water. Dry your hands with a clean towel.  Put on your gloves.  Remove any tape.  Carefully remove the old dressing. If the dressing sticks, you may dampen it with warm water to loosen it, or follow your caregiver's specific directions.  Remove any gauze or packing tape that is in your wound.  Take off your gloves.  Put the gloves, tape, gauze, or any packing tape into a plastic bag. CHANGING YOUR DRESSING  Open the supplies.  Take the cap off the saline solution.  Open the gauze package so that the gauze remains on the inside of the package.  Put on your gloves.  Clean your wound as told by your caregiver.  If you have been told to keep your wound dry, follow those instructions.  Your caregiver may tell you to do one or more of the following:  Pick up the gauze. Pour the saline solution over the gauze. Squeeze out the extra saline solution.  Put medicated cream or other medicine on your wound if you have been told to do so.  Put the solution soaked gauze only in your wound, not on the skin around it.  Pack your wound loosely or as told by your caregiver.  Put dry gauze on your wound.  Put abdominal  dressing pads over the dry gauze if your wet gauze soaks through.  Tape the abdominal dressing pads in place so they will not fall off. Do not wrap the tape completely around the affected part (arm, leg, abdomen).  Wrap the dressing pads with a flexible gauze dressing to secure it in place.  Take off your gloves. Put them in the plastic bag with the old dressing. Tie the bag shut and throw it away.  Keep the dressing clean and dry until your next dressing change.  Wash your hands. SEEK MEDICAL CARE IF:  Your skin around the wound looks red.  Your wound feels more tender or sore.  You see pus in the wound.  Your wound smells bad.  You have a fever.  Your skin around the wound has a rash that itches and burns.  You see black or yellow skin in your wound that was not there before.  You feel nauseous, throw up, and feel very tired. Document Released: 12/19/2004 Document Revised: 02/03/2012 Document Reviewed: 09/23/2011 [facility] Patient Information 2015 [employer] [place]. This information is not intended to replace advice given to you by your health care provider. Make sure you discuss any questions you have with your health care provider.

## 2015-06-06 NOTE — Care Management Note (Addendum)
Case Management Note  Patient Details  Name: [name] MRN: [id number] Date of Birth: 1976-05-13  Subjective/Objective:     abscesses                Action/Plan:  Home Health Expected Discharge Date:                  Expected Discharge Plan:  Home w Home Health Services  In-House Referral:     Discharge planning Services  CM Consult, MATCH Program, Follow-up appt scheduled, Medication Assistance  Post Acute Care Choice:    Choice offered to:     DME Arranged:    DME Agency:     HH Arranged:  RN HH Agency:  [facility]  Status of Service:  Completed, signed off  Medicare Important Message Given:    Date Medicare IM Given:    Medicare IM give by:    Date Additional Medicare IM Given:    Additional Medicare Important Message give by:     If discussed at Long Length of Stay Meetings, dates discussed:    Additional Comments: NCM spoke to pt and states she had Medicaid but currently it lapsed. She plans to follow up with Medicaid Caseworker to help with getting Medicaid restarted.  Provided pt with MATCH letter to take with her to pharmacy to assist with medications. [facility]. [facility] provided for hospital follow up appointment on 7/19 at 9 am. Explained to pt she will need to bring photo ID, and $20 copay.   Elliot CousinShavis, Brittain Hosie Ellen, RN 06/06/2015, 10:47 AM

## 2015-06-08 LAB — ANAEROBIC CULTURE

## 2015-06-13 ENCOUNTER — Inpatient Hospital Stay: Payer: Medicaid Other | Admitting: Internal Medicine

## 2016-02-02 ENCOUNTER — Emergency Department (HOSPITAL_COMMUNITY)
Admission: EM | Admit: 2016-02-02 | Discharge: 2016-02-02 | Disposition: A | Discharge status: Home / Self Care | Payer: Self-pay | Attending: Emergency Medicine | Admitting: Emergency Medicine

## 2016-02-02 ENCOUNTER — Encounter (HOSPITAL_COMMUNITY): Payer: Self-pay | Admitting: Emergency Medicine

## 2016-02-02 ENCOUNTER — Emergency Department (HOSPITAL_COMMUNITY): Payer: Self-pay

## 2016-02-02 DIAGNOSIS — Z791 Long term (current) use of non-steroidal anti-inflammatories (NSAID): Secondary | ICD-10-CM | POA: Insufficient documentation

## 2016-02-02 DIAGNOSIS — B349 Viral infection, unspecified: Secondary | ICD-10-CM | POA: Insufficient documentation

## 2016-02-02 DIAGNOSIS — Z8742 Personal history of other diseases of the female genital tract: Secondary | ICD-10-CM | POA: Insufficient documentation

## 2016-02-02 DIAGNOSIS — Z792 Long term (current) use of antibiotics: Secondary | ICD-10-CM | POA: Insufficient documentation

## 2016-02-02 DIAGNOSIS — Z87891 Personal history of nicotine dependence: Secondary | ICD-10-CM | POA: Insufficient documentation

## 2016-02-02 DIAGNOSIS — F329 Major depressive disorder, single episode, unspecified: Secondary | ICD-10-CM | POA: Insufficient documentation

## 2016-02-02 DIAGNOSIS — Z8619 Personal history of other infectious and parasitic diseases: Secondary | ICD-10-CM | POA: Insufficient documentation

## 2016-02-02 LAB — CBC
HEMATOCRIT: 36.8 % (ref 36.0–46.0)
HEMOGLOBIN: 11.7 g/dL — AB (ref 12.0–15.0)
MCH: 25.7 pg — ABNORMAL LOW (ref 26.0–34.0)
MCHC: 31.8 g/dL (ref 30.0–36.0)
MCV: 80.9 fL (ref 78.0–100.0)
PLATELETS: 504 10*3/uL — AB (ref 150–400)
RBC: 4.55 MIL/uL (ref 3.87–5.11)
RDW: 16.2 % — ABNORMAL HIGH (ref 11.5–15.5)
WBC: 13.5 10*3/uL — ABNORMAL HIGH (ref 4.0–10.5)

## 2016-02-02 LAB — COMPREHENSIVE METABOLIC PANEL
ALBUMIN: 3.7 g/dL (ref 3.5–5.0)
ALK PHOS: 89 U/L (ref 38–126)
ALT: 29 U/L (ref 14–54)
AST: 26 U/L (ref 15–41)
Anion gap: 15 (ref 5–15)
BUN: 10 mg/dL (ref 6–20)
CALCIUM: 9 mg/dL (ref 8.9–10.3)
CO2: 21 mmol/L — ABNORMAL LOW (ref 22–32)
Chloride: 105 mmol/L (ref 101–111)
Creatinine, Ser: 0.7 mg/dL (ref 0.44–1.00)
GFR calc Af Amer: >60 mL/min (ref >60)
GFR calc non Af Amer: >60 mL/min (ref >60)
GLUCOSE: 103 mg/dL — AB (ref 65–99)
Potassium: 3.6 mmol/L (ref 3.5–5.1)
SODIUM: 141 mmol/L (ref 135–145)
Total Bilirubin: 0.2 mg/dL — ABNORMAL LOW (ref 0.3–1.2)
Total Protein: 7.5 g/dL (ref 6.5–8.1)

## 2016-02-02 LAB — LIPASE, BLOOD: Lipase: 37 U/L (ref 11–51)

## 2016-02-02 NOTE — ED Notes (Signed)
Pt c/o cough, body aches, N/V/D, fever ongoing for a while. Pt reports that her son was recently sick and now her daughter is over in the PEDS side.

## 2016-02-02 NOTE — ED Notes (Signed)
Patient in gown, on continuous pulse oximetry and blood pressure cuff; Gabe, RN at bedside

## 2016-02-02 NOTE — ED Notes (Signed)
Brought patient back to room; patient getting undressed and into a gown at this time; warm blanket given 

## 2016-02-02 NOTE — Discharge Instructions (Signed)
Please read and follow all provided instructions.  Your diagnoses today include:  1. Viral syndrome    You appear to have an upper respiratory infection (URI). An upper respiratory tract infection, or cold, is a viral infection of the air passages leading to the lungs. It should improve gradually after 5-7 days. You may have a lingering cough that lasts for 2- 4 weeks after the infection.  Tests performed today include:  Vital signs. See below for your results today.   Medications prescribed:   Take any prescribed medications only as directed. Treatment for your infection is aimed at treating the symptoms. There are no medications, such as antibiotics, that will cure your infection.   Home care instructions:  Follow any educational materials contained in this packet.   Your illness is contagious and can be spread to others, especially during the first 3 or 4 days. It cannot be cured by antibiotics or other medicines. Take basic precautions such as washing your hands often, covering your mouth when you cough or sneeze, and avoiding public places where you could spread your illness to others.   Please continue drinking plenty of fluids.  Use over-the-counter medicines as needed as directed on packaging for symptom relief.  You may also use ibuprofen or tylenol as directed on packaging for pain or fever.  Do not take multiple medicines containing Tylenol or acetaminophen to avoid taking too much of this medication.  Follow-up instructions: Please follow-up with your primary care provider in the next 3 days for further evaluation of your symptoms if you are not feeling better.   Return instructions:   Please return to the Emergency Department if you experience worsening symptoms.   RETURN IMMEDIATELY IF you develop shortness of breath, confusion or altered mental status, a new rash, become dizzy, faint, or poorly responsive, or are unable to be cared for at home.  Please return if you have  persistent vomiting and cannot keep down fluids or develop a fever that is not controlled by tylenol or motrin.    Please return if you have any other emergent concerns.  Additional Information:  Your vital signs today were: BP 130/97 mmHg   Pulse 77   Temp(Src) 98.2 F (36.8 C) (Oral)   Resp 16   Wt 85.049 kg   SpO2 99%   LMP 02/01/2016 If your blood pressure (BP) was elevated above 135/85 this visit, please have this repeated by your doctor within one month. --------------

## 2016-02-02 NOTE — ED Notes (Signed)
Patient called in main ED waiting area with no response 

## 2016-02-02 NOTE — ED Provider Notes (Signed)
CSN: [id number]     Arrival date & time 02/02/16  0801 History   First MD Initiated Contact with Patient 02/02/16 0957     Chief Complaint  Patient presents with  . Sore Throat  . Generalized Body Aches  . Cough  . Diarrhea   (Consider location/radiation/quality/duration/timing/severity/associated sxs/prior Treatment) HPI 40 y.o. female presents to the Emergency Department today complaining of generalized aches and productive cough x 1 week. Associated subjective fever, N/V, chills. No CP/ABD pain. No congestion. No ear aches. Has tried OTC Tylenol with minimal relief. Notes sick contacts with children. No pain currently. No other symptoms noted.    Past Medical History  Diagnosis Date  . Depression   . PID (acute pelvic inflammatory disease)   . Gonorrhea    Past Surgical History  Procedure Laterality Date  . Breast surgery    . Induced abortion    . Cesarean section      X 3  . Cesarean section with bilateral tubal ligation Bilateral 12/30/2013    Procedure: CESAREAN SECTION WITH BILATERAL TUBAL LIGATION;  Surgeon: Kathreen Cosier, MD;  Location: [facility];  Service: Obstetrics;  Laterality: Bilateral;  . Incision and drainage abscess Left 06/03/2015    Procedure: INCISION AND DRAINAGE ABSCESS;  Surgeon: Axel Filler, MD;  Location: [facility] OR;  Service: General;  Laterality: Left;   Family History  Problem Relation Age of Onset  . Alcohol abuse Neg Hx   . Arthritis Neg Hx   . Asthma Neg Hx   . Birth defects Neg Hx   . Cancer Neg Hx   . COPD Neg Hx   . Depression Neg Hx   . Diabetes Neg Hx   . Drug abuse Neg Hx   . Early death Neg Hx   . Hearing loss Neg Hx   . Heart disease Neg Hx   . Hyperlipidemia Neg Hx   . Hypertension Neg Hx   . Kidney disease Neg Hx   . Learning disabilities Neg Hx   . Mental illness Neg Hx   . Mental retardation Neg Hx   . Miscarriages / Stillbirths Neg Hx   . Stroke Neg Hx   . Vision loss Neg Hx   . Varicose Veins Neg Hx    Social History   Substance Use Topics  . Smoking status: Former Smoker -- 0.25 packs/day    Types: Cigarettes  . Smokeless tobacco: None  . Alcohol Use: No   OB History    Gravida Para Term Preterm AB TAB SAB Ectopic Multiple Living   0   4     Review of Systems ROS reviewed and all are negative for acute change except as noted in the HPI.  Allergies  Review of patient's allergies indicates no known allergies.  Home Medications   Prior to Admission medications   Medication Sig Start Date End Date Taking? Authorizing Provider  cephALEXin (KEFLEX) 500 MG capsule Take 1 capsule (500 mg total) by mouth every 6 (six) hours. 06/05/15   Nonie Hoyer, PA-C  docusate sodium (COLACE) 100 MG capsule Take 1 capsule (100 mg total) by mouth 2 (two) times daily as needed for mild constipation. 06/05/15   Nonie Hoyer, PA-C  methocarbamol (ROBAXIN) 500 MG tablet Take 2 tablets (1,000 mg total) by mouth every 8 (eight) hours as needed for muscle spasms (or pain). 06/05/15   Nonie Hoyer, PA-C  mupirocin ointment (BACTROBAN) 2 % Place 1 application into  the nose 2 (two) times daily. Take tube from room home 06/05/15   Nonie Hoyer, PA-C  naproxen (NAPROSYN) 500 MG tablet Take 1 tablet (500 mg total) by mouth 2 (two) times daily with a meal. 06/05/15   Nonie Hoyer, PA-C  oxyCODONE-acetaminophen (PERCOCET/ROXICET) 5-325 MG per tablet Take 1-2 tablets by mouth every 4 (four) hours as needed for severe pain (moderate - severe pain). 06/05/15   Nonie Hoyer, PA-C  polyethylene glycol (MIRALAX / GLYCOLAX) packet Take 17 g by mouth daily as needed for mild constipation. 06/05/15   Nonie Hoyer, PA-C  traMADol (ULTRAM) 50 MG tablet Take 1-2 tablets (50-100 mg total) by mouth every 6 (six) hours as needed for moderate pain or severe pain. 06/05/15   Megan N Baird, PA-C   BP 130/97 mmHg  Pulse 77  Temp(Src) 98.2 F (36.8 C) (Oral)  Resp 16  Wt 85.049 kg  SpO2 99%  LMP 02/01/2016   Physical Exam   Constitutional: She is oriented to person, place, and time. She appears well-developed and well-nourished.  HENT:  Head: Normocephalic and atraumatic.  Right Ear: Tympanic membrane, external ear and ear canal normal.  Left Ear: Tympanic membrane and ear canal normal.  Nose: Nose normal.  Mouth/Throat: Uvula is midline, oropharynx is clear and moist and mucous membranes are normal. No trismus in the jaw. No posterior oropharyngeal erythema or tonsillar abscesses.  Eyes: EOM are normal. Pupils are equal, round, and reactive to light.  Neck: Normal range of motion. Neck supple. No tracheal deviation present.  Cardiovascular: Normal rate, regular rhythm and normal heart sounds.   Pulmonary/Chest: Effort normal and breath sounds normal. No respiratory distress. She has no wheezes. She has no rales.  Abdominal: Soft. There is no tenderness.  Musculoskeletal: Normal range of motion.  Neurological: She is alert and oriented to person, place, and time.  Skin: Skin is warm and dry.  Psychiatric: She has a normal mood and affect. Her behavior is normal. Thought content normal.  Nursing note and vitals reviewed.   ED Course  Procedures (including critical care time) Labs Review Labs Reviewed  COMPREHENSIVE METABOLIC PANEL - Abnormal; Notable for the following:    CO2 21 (*)    Glucose, Bld 103 (*)    Total Bilirubin 0.2 (*)    All other components within normal limits  CBC - Abnormal; Notable for the following:    WBC 13.5 (*)    Hemoglobin 11.7 (*)    MCH 25.7 (*)    RDW 16.2 (*)    Platelets 504 (*)    All other components within normal limits  LIPASE, BLOOD  URINALYSIS, ROUTINE W REFLEX MICROSCOPIC (NOT AT [facility])    Imaging Review Dg Chest 2 View  02/02/2016  CLINICAL DATA:  Productive cough for 1-1/2 weeks EXAM: CHEST  2 VIEW COMPARISON:  04/15/2012 FINDINGS: The heart size and mediastinal contours are within normal limits. Both lungs are clear. The visualized skeletal structures  are unremarkable. IMPRESSION: No active cardiopulmonary disease. Electronically Signed   By: Alcide Clever M.D.   On: 02/02/2016 10:31   I have personally reviewed and evaluated these images and lab results as part of my medical decision-making.   EKG Interpretation None      MDM  I have reviewed and evaluated the relevant laboratory values.I have reviewed and evaluated the relevant imaging studies.I personally evaluated and interpreted the relevant EKG.I have reviewed the relevant previous healthcare records.I obtained HPI from historian.  ED  Course:  Assessment: Pt is a 39yF presents with URI symptoms x 1 week . On exam, pt in NAD. VSS. Afebrile. Lungs CTA, Heart RRR. Abdomen nontender/soft. Pt CXR negative for acute infiltrate. Patients symptoms are consistent with URI, likely viral etiology. Discussed that antibiotics are not indicated for viral infections. Pt will be discharged with symptomatic treatment.  Verbalizes understanding and is agreeable with plan. Pt is hemodynamically stable & in NAD prior to dc.    Disposition/Plan:  DC Home Additional Verbal discharge instructions given and discussed with patient.  Pt Instructed to f/u with PCP in the next 48 hours for evaluation and treatment of symptoms. Return precautions given Pt acknowledges and agrees with plan  Supervising Physician Nelva Nayobert Beaton, MD   Final diagnoses:  Viral syndrome        Audry Piliyler Jaquel Coomer, PA-C 02/02/16 1036  Nelva Nayobert Beaton, MD 02/02/16 1055

## 2016-02-02 NOTE — ED Notes (Signed)
Pt is now located in adult waiting room.

## 2016-09-10 IMAGING — CR DG CHEST 2V
2 series · 2 of 2 positions shown · non-contrast
Comparison: 04/15/2012

CLINICAL DATA: Productive cough for 1-1/2 weeks

EXAM:
CHEST  2 VIEW

[chest pa]
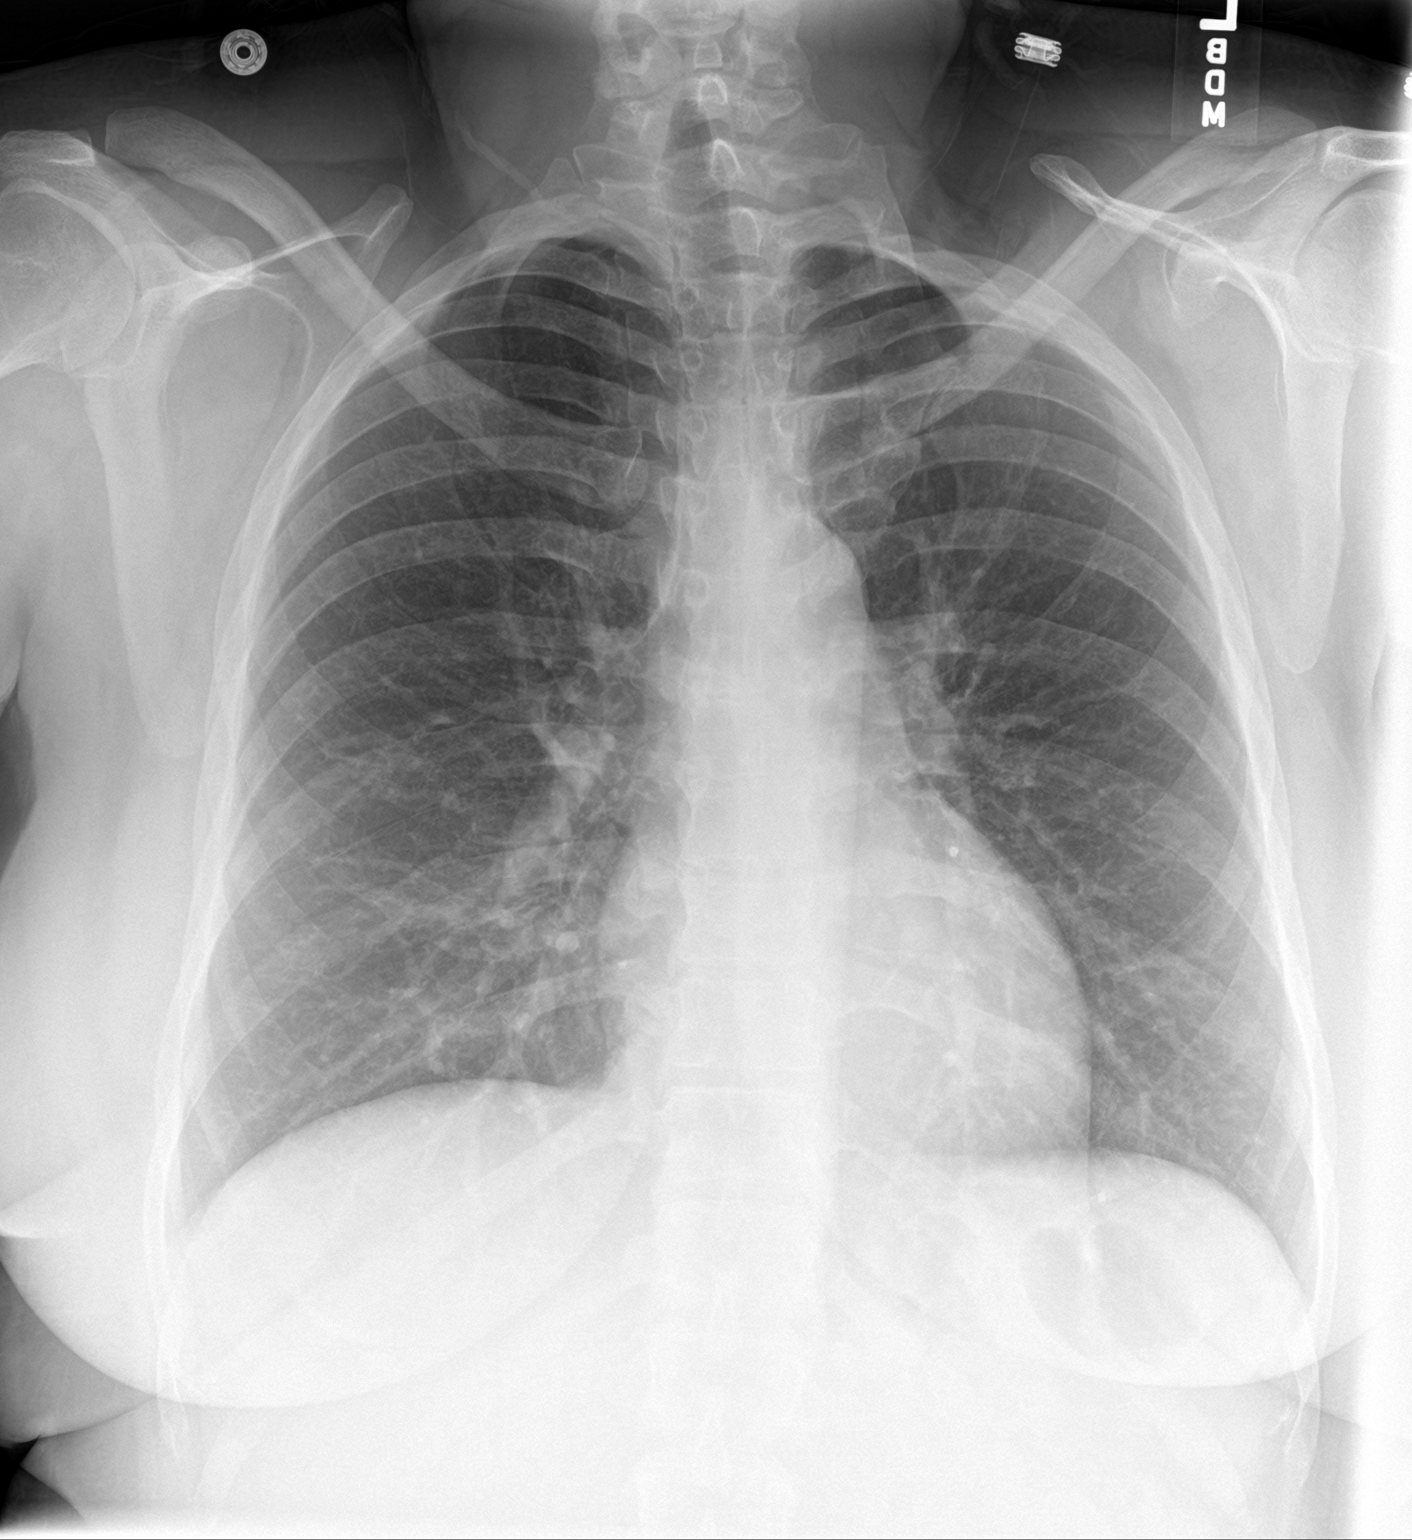

[chest lat]
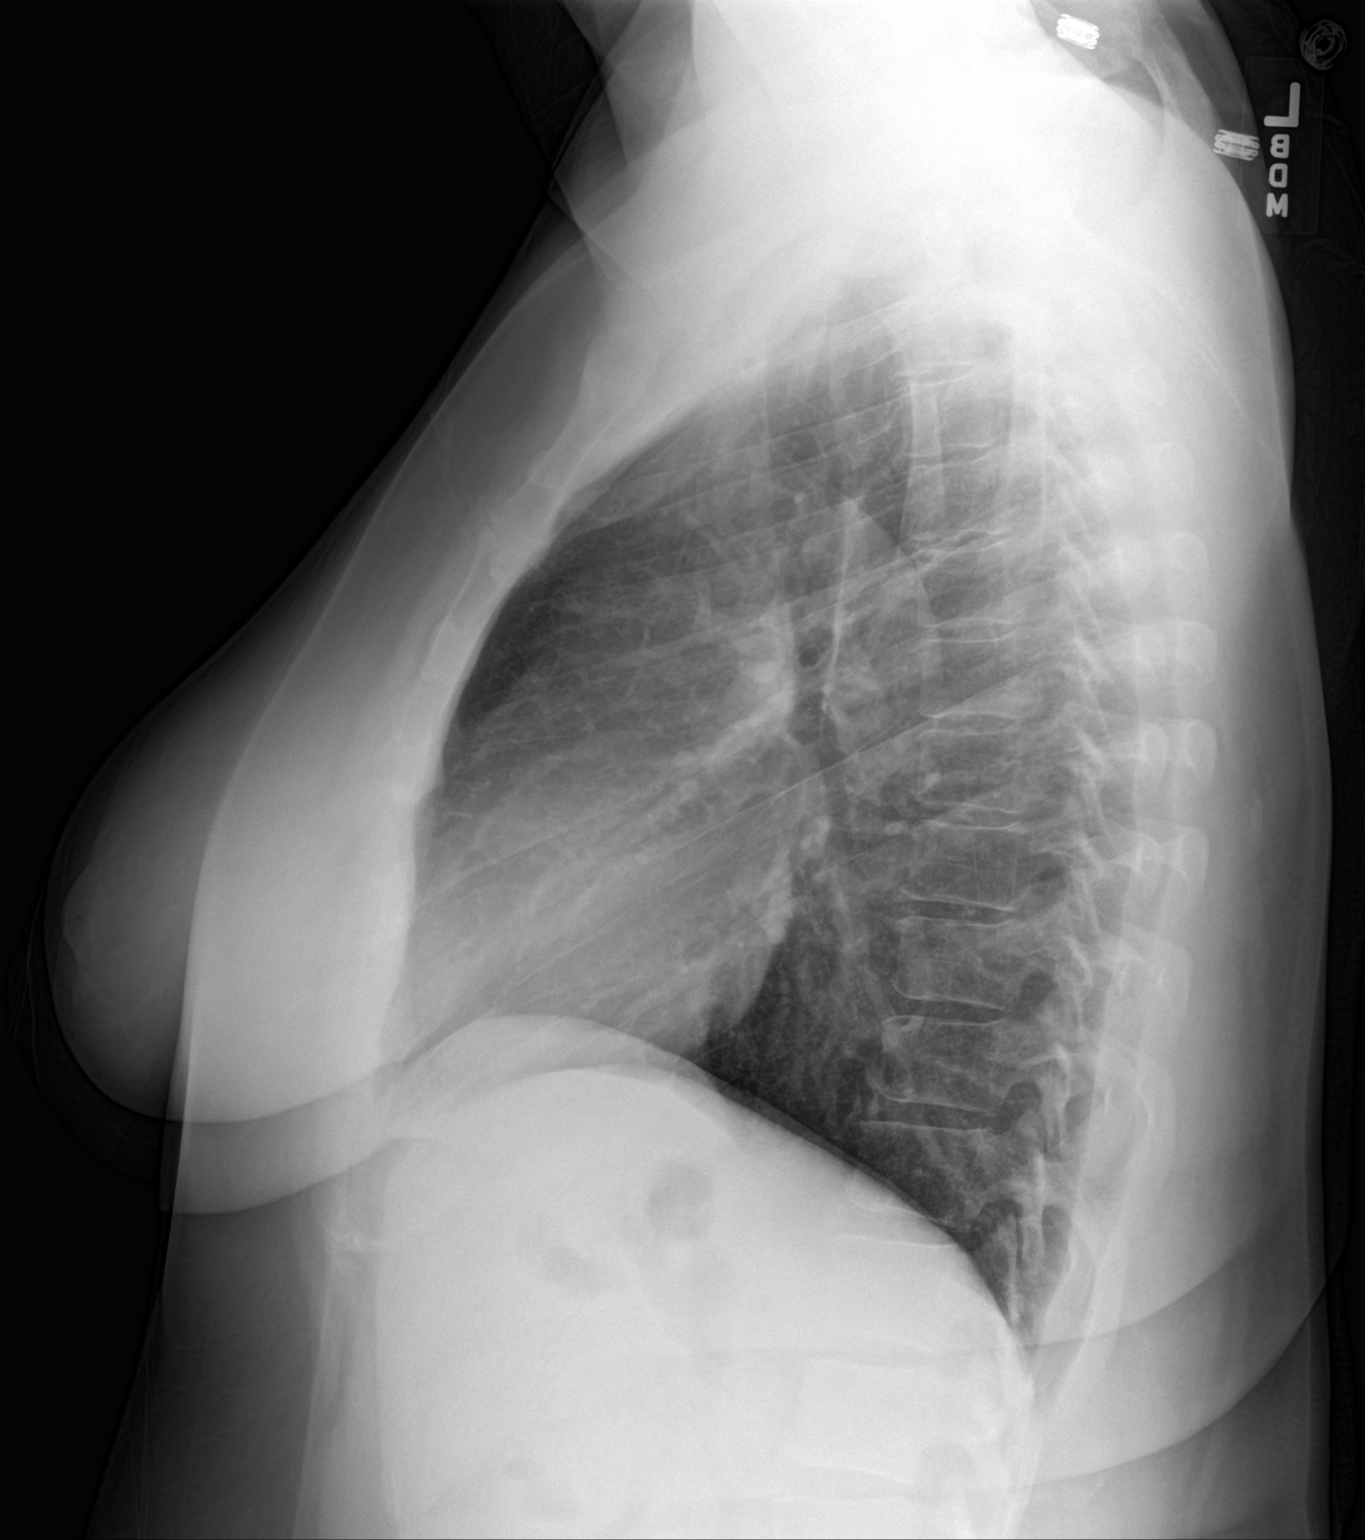

[2 of 2 positions shown; findings below may reference images not displayed]

FINDINGS: The heart size and mediastinal contours are within normal limits.
Both lungs are clear. The visualized skeletal structures are
unremarkable.
IMPRESSION: No active cardiopulmonary disease.

## 2024-04-09 ENCOUNTER — Other Ambulatory Visit: Payer: Self-pay

## 2024-04-09 ENCOUNTER — Encounter (HOSPITAL_BASED_OUTPATIENT_CLINIC_OR_DEPARTMENT_OTHER): Payer: Self-pay | Admitting: Emergency Medicine

## 2024-04-09 ENCOUNTER — Other Ambulatory Visit (HOSPITAL_BASED_OUTPATIENT_CLINIC_OR_DEPARTMENT_OTHER): Payer: Self-pay

## 2024-04-09 ENCOUNTER — Emergency Department (HOSPITAL_BASED_OUTPATIENT_CLINIC_OR_DEPARTMENT_OTHER)
Admission: EM | Admit: 2024-04-09 | Discharge: 2024-04-09 | Disposition: A | Discharge status: Home / Self Care | Attending: Emergency Medicine | Admitting: Emergency Medicine

## 2024-04-09 DIAGNOSIS — B372 Candidiasis of skin and nail: Secondary | ICD-10-CM | POA: Diagnosis not present

## 2024-04-09 DIAGNOSIS — L539 Erythematous condition, unspecified: Secondary | ICD-10-CM | POA: Diagnosis present

## 2024-04-09 MED ORDER — CLOTRIMAZOLE 1 % EX CREA
TOPICAL_CREAM | CUTANEOUS | 0 refills | Status: AC
  Filled 2024-04-09: qty 28, 28d supply, fill #0

## 2024-04-09 NOTE — Discharge Instructions (Addendum)
 You have a yeast infection of the skin of your lower abdomen.  Please wash your skin daily with soap and water.  Pat the area dry to reduce moisture.  Apply the clotrimazole to the area cream twice daily for the next 2 weeks.   Please follow-up with your PCP if symptoms not improving within the next 2 weeks  Return to the ER for any other emergent concerns.

## 2024-04-09 NOTE — ED Notes (Signed)
 D/c paperwork reviewed with pt, including prescriptions and follow up care.  All questions and/or concerns addressed at time of d/c.  No further needs expressed. . Pt verbalized understanding, Ambulatory without assistance to ED exit, NAD.

## 2024-04-09 NOTE — ED Triage Notes (Signed)
 Pt POV reports c-section 10 years ago, reports site of incision is possibly infected, reports redness, foul smell, burning at scar site and adjacent. Also c/o soreness and moisture at belly button as well.   Denies fever.

## 2024-04-09 NOTE — ED Provider Notes (Signed)
 [place] [facility] Provider Note   CSN: [id number] Arrival date & time: 04/09/24  1149     History  Chief Complaint  Patient presents with   Wound Check    [name] is a 48 y.o. female with no significant past medical history who presents with concern for the skin of her lower abdomen feeling warm moist and having some redness over the past couple days.  Reports this has occurred before, usually occurs when the weather gets humid.  Denies any recent abdominal surgeries, lacerations or abrasions of the abdomen.  Denies any abdominal pain, fever or chills.   Wound Check       Home Medications Prior to Admission medications   Medication Sig Start Date End Date Taking? Authorizing Provider  clotrimazole  (LOTRIMIN ) 1 % cream Apply to affected area 2 times daily for 2 weeks 04/09/24  Yes Rexie Catena, PA-C  cephALEXin  (KEFLEX ) 500 MG capsule Take 1 capsule (500 mg total) by mouth every 6 (six) hours. 06/05/15   Baird, Megan N, PA-C  docusate sodium  (COLACE) 100 MG capsule Take 1 capsule (100 mg total) by mouth 2 (two) times daily as needed for mild constipation. 06/05/15   Baird, Megan N, PA-C  methocarbamol  (ROBAXIN ) 500 MG tablet Take 2 tablets (1,000 mg total) by mouth every 8 (eight) hours as needed for muscle spasms (or pain). 06/05/15   Baird, Megan N, PA-C  mupirocin  ointment (BACTROBAN ) 2 % Place 1 application into the nose 2 (two) times daily. Take tube from room home 06/05/15   Baird, Megan N, PA-C  naproxen  (NAPROSYN ) 500 MG tablet Take 1 tablet (500 mg total) by mouth 2 (two) times daily with a meal. 06/05/15   Adelia Homestead, PA-C  oxyCODONE -acetaminophen  (PERCOCET/ROXICET) 5-325 MG per tablet Take 1-2 tablets by mouth every 4 (four) hours as needed for severe pain (moderate - severe pain). 06/05/15   Baird, Megan N, PA-C  polyethylene glycol (MIRALAX  / GLYCOLAX ) packet Take 17 g by mouth daily as needed for mild constipation.  06/05/15   Baird, Megan N, PA-C  traMADol  (ULTRAM ) 50 MG tablet Take 1-2 tablets (50-100 mg total) by mouth every 6 (six) hours as needed for moderate pain or severe pain. 06/05/15   Baird, Megan N, PA-C      Allergies    Patient has no known allergies.    Review of Systems   Review of Systems  Skin:  Positive for color change.    Physical Exam Updated Vital Signs BP 119/88   Pulse 96   Temp 99.2 F (37.3 C) (Oral)   Resp 15   Ht 5\' 6"  (1.676 m)   LMP 03/04/2024   SpO2 99%   BMI 30.26 kg/m  Physical Exam Vitals and nursing note reviewed.  Constitutional:      Appearance: Normal appearance.  HENT:     Head: Atraumatic.  Cardiovascular:     Rate and Rhythm: Normal rate and regular rhythm.  Pulmonary:     Effort: Pulmonary effort is normal.  Skin:    Comments: Patient with skin fold of the lower abdomen.  Underneath the skin fold is moist and has some mild erythema.  No associated lacerations or abrasions  Neurological:     General: No focal deficit present.     Mental Status: She is alert.  Psychiatric:        Mood and Affect: Mood normal.        Behavior: Behavior normal.  ED Results / Procedures / Treatments   Labs (all labs ordered are listed, but only abnormal results are displayed) Labs Reviewed - No data to display  EKG None  Radiology No results found.  Procedures Procedures    Medications Ordered in ED Medications - No data to display  ED Course/ Medical Decision Making/ A&P                                 Medical Decision Making    Differential diagnosis includes but is not limited to contact dermatitis, cellulitis, Candida infection  ED Course:  Upon initial evaluation, patient is well-appearing, stable vital signs.  Upon exam, has a skin fold of her lower abdomen, and underneath the skin fold, is moist skin with some overlying erythema.  This appears more like a candidal infection rather than bacterial.  No overlying lacerations or  abrasions.  No vital sign abnormalities or abdominal pain to suggest systemic infection or acute intra-abdominal pathology.  Will treat with course of clotrimazole  cream.  Stable and appropriate for discharge home    Impression: Candidal dermatitis  Disposition:  The patient was discharged home with instructions to wash skin with soap and water daily, pat dry.  Apply clotrimazole  cream to the area twice daily for the next 2 weeks. Follow up with PCP if symptoms not improving within the next 2 weeks. Return precautions given.    This chart was dictated using voice recognition software, Dragon. Despite the best efforts of this provider to proofread and correct errors, errors may still occur which can change documentation meaning.          Final Clinical Impression(s) / ED Diagnoses Final diagnoses:  Candidal dermatitis    Rx / DC Orders ED Discharge Orders          Ordered    clotrimazole  (LOTRIMIN ) 1 % cream        04/09/24 1247              Rexie Catena, PA-C 04/09/24 1304    Wynetta Heckle, MD 04/14/24 [phone]
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
## 2024-12-21 ENCOUNTER — Emergency Department (HOSPITAL_BASED_OUTPATIENT_CLINIC_OR_DEPARTMENT_OTHER)
Admission: EM | Admit: 2024-12-21 | Discharge: 2024-12-21 | Disposition: A | Discharge status: Home / Self Care | Payer: MEDICAID | Attending: Emergency Medicine | Admitting: Emergency Medicine

## 2024-12-21 ENCOUNTER — Encounter (HOSPITAL_BASED_OUTPATIENT_CLINIC_OR_DEPARTMENT_OTHER): Payer: Self-pay

## 2024-12-21 ENCOUNTER — Other Ambulatory Visit: Payer: Self-pay

## 2024-12-21 ENCOUNTER — Emergency Department (HOSPITAL_BASED_OUTPATIENT_CLINIC_OR_DEPARTMENT_OTHER): Payer: MEDICAID

## 2024-12-21 DIAGNOSIS — M25512 Pain in left shoulder: Secondary | ICD-10-CM | POA: Insufficient documentation

## 2024-12-21 DIAGNOSIS — M79651 Pain in right thigh: Secondary | ICD-10-CM | POA: Insufficient documentation

## 2024-12-21 DIAGNOSIS — M79652 Pain in left thigh: Secondary | ICD-10-CM | POA: Diagnosis not present

## 2024-12-21 MED ORDER — NAPROXEN 500 MG PO TABS: 500.0000 mg | ORAL_TABLET | Freq: Two times a day (BID) | ORAL | 0 refills | Status: AC

## 2024-12-21 MED ORDER — METHOCARBAMOL 500 MG PO TABS: 500.0000 mg | ORAL_TABLET | Freq: Two times a day (BID) | ORAL | 0 refills | Status: AC

## 2024-12-21 NOTE — Discharge Instructions (Signed)
 Begin taking naproxen  as prescribed for pain.  Do not take extra ibuprofen  at the same time.  May take Robaxin  2-3 times a day as needed for pain/muscle spasms.  Apply ice to the shoulder to help with pain as well.  Please follow-up with orthopedics for further evaluation and management of continued pain as needed.  You have been ordered an ultrasound for both legs.  You will be called to schedule appointment.  Return to ED if any symptoms worsen including severe swelling to the arm, numbness/tingling/weakness, difficulty breathing, inability to walk.

## 2024-12-21 NOTE — ED Provider Notes (Signed)
 " [place] [facility] Provider Note   CSN: [id number] Arrival date & time: 12/21/24  1547     Patient presents with: Shoulder Pain and Leg Pain   [name] is a 49 y.o. female.  Patient is a 49 year old female who presents to the ED for left shoulder pain 5 days.  States she believes she slept weird on the arm and has had difficulty moving the arm since.  Denies specific fall or injury.  No previous surgeries on the arm.  States that he left shoulder hurts worse if she tries to lift it above her head.  Has not taken any medications for pain.  She also notes she has had some cramping in the thighs for the past week.  States it is worse on the inner thighs and worse at nighttime.  Notes is on both legs.  States she is concerned for a blood clot.  Has not noticed any redness or swelling.  No history of blood clots she is not on blood thinners, no recent long distance travel, estrogen use, shortness of breath.  No further complaints.    Shoulder Pain Associated symptoms: no fever   Leg Pain Associated symptoms: no fever        Prior to Admission medications  Medication Sig Start Date End Date Taking? Authorizing Provider  methocarbamol  (ROBAXIN ) 500 MG tablet Take 1 tablet (500 mg total) by mouth 2 (two) times daily. 12/21/24  Yes Dierdre Mccalip, Thersia GORMAN, PA-C  naproxen  (NAPROSYN ) 500 MG tablet Take 1 tablet (500 mg total) by mouth 2 (two) times daily. 12/21/24  Yes Regan Llorente, Thersia GORMAN, PA-C  cephALEXin  (KEFLEX ) 500 MG capsule Take 1 capsule (500 mg total) by mouth every 6 (six) hours. 06/05/15   Baird, Megan N, PA-C  clotrimazole  (LOTRIMIN ) 1 % cream Apply to affected area 2 times daily for 2 weeks 04/09/24   Veta Palma, PA-C  docusate sodium  (COLACE) 100 MG capsule Take 1 capsule (100 mg total) by mouth 2 (two) times daily as needed for mild constipation. 06/05/15   Baird, Megan N, PA-C  methotrexate (RHEUMATREX) 2.5 MG tablet Take 15 mg by mouth once a  week.    [provider]  mupirocin  ointment (BACTROBAN ) 2 % Place 1 application into the nose 2 (two) times daily. Take tube from room home 06/05/15   Elwin Duwaine SAILOR, PA-C  oxyCODONE -acetaminophen  (PERCOCET/ROXICET) 5-325 MG per tablet Take 1-2 tablets by mouth every 4 (four) hours as needed for severe pain (moderate - severe pain). 06/05/15   Baird, Megan N, PA-C  polyethylene glycol (MIRALAX  / GLYCOLAX ) packet Take 17 g by mouth daily as needed for mild constipation. 06/05/15   Baird, Megan N, PA-C  traMADol  (ULTRAM ) 50 MG tablet Take 1-2 tablets (50-100 mg total) by mouth every 6 (six) hours as needed for moderate pain or severe pain. 06/05/15   Baird, Megan N, PA-C    Allergies: Patient has no known allergies.    Review of Systems  Constitutional:  Negative for fever.  Respiratory:  Negative for shortness of breath.   Cardiovascular:  Negative for chest pain.  Musculoskeletal:  Positive for arthralgias.  All other systems reviewed and are negative.   Updated Vital Signs BP (!) 128/101 (BP Location: Right Arm)   Pulse 96   Temp 98.6 F (37 C) (Oral)   Resp 16   LMP 11/29/2024   SpO2 97%   Physical Exam Constitutional:      Appearance: Normal appearance.  HENT:     Head: Normocephalic and atraumatic.     Nose: Nose normal.     Mouth/Throat:     Mouth: Mucous membranes are moist.     Pharynx: Oropharynx is clear.  Cardiovascular:     Pulses: Normal pulses.     Comments: Radial and PT pulses 2+ bilaterally Musculoskeletal:     Comments: Limited overhead range of motion of the left shoulder secondary to pain.  Tender to palpation on the anterior lateral left shoulder.  No obvious deformities, erythema, edema.  Bicep contracting normally.  Tender to palpation on the central bilateral thighs.  No deformities, erythema, edema, warmth.  Ambulatory with a normal gait.  No calf tenderness.  Skin:    General: Skin is warm and dry.  Neurological:     Mental Status: She is  alert and oriented to person, place, and time.  Psychiatric:        Mood and Affect: Mood normal.        Behavior: Behavior normal.     (all labs ordered are listed, but only abnormal results are displayed) Labs Reviewed - No data to display  EKG: None  Radiology: DG Shoulder Left Result Date: 12/21/2024 CLINICAL DATA:  shoulder pain EXAM: LEFT SHOULDER - 2+ VIEW COMPARISON:  02/02/2016 chest x-ray FINDINGS: There is no evidence of fracture or dislocation. There is no evidence of arthropathy or other focal bone abnormality. Soft tissues are unremarkable. IMPRESSION: No acute or significant finding by plain radiography Electronically Signed   By: CHRISTELLA.  Shick M.D.   On: 12/21/2024 16:48      Medications Ordered in the ED - No data to display                                 Medical Decision Making Patient is a 49 year old female who presents to the ED for left shoulder pain for the past 5 days as well as leg cramping in the thighs for the past week.  Please see detailed HPI above.  On exam patient is alert and in no acute distress.  Physical exam as noted above.  Differential for shoulder includes acute muscle strain, frozen shoulder, arthritis, impingement, rotator cuff etiology.  Differential for her legs includes dehydration, muscle strain, fracture, DVT.  X-ray of the left shoulder reviewed and negative for acute abnormality.  Suspect patient's pain most likely secondary to impingement, she notes that symptoms began after waking up 1 evening.  Less concerns for ACS as pain has been ongoing for several days with no associated chest pain or shortness of breath.  No risk factors for PE DVT.  Less concerns for acute DVT in the lower extremities, suspect this is also musculoskeletal in nature.  Unfortunate ultrasound is not available this evening but will obtain outpatient.  She is otherwise well-appearing with vital signs stable.  Ambulatory as well.  Stable for discharge home.  Prescribed  naproxen  and Robaxin .  Symptomatic care discussed.  Resources for orthopedic follow-up provided for shoulder as needed.  Outpatient ultrasound for bilateral lower extremities has been ordered as well.  Patient understands plan and is agreeable.  Return precautions provided.  Amount and/or Complexity of Data Reviewed Radiology: ordered.  Risk Prescription drug management.       Final diagnoses:  Acute pain of left shoulder  Bilateral thigh pain    ED Discharge Orders          Ordered  methocarbamol  (ROBAXIN ) 500 MG tablet  2 times daily        12/21/24 1750    naproxen  (NAPROSYN ) 500 MG tablet  2 times daily        12/21/24 1750    US  Venous Img Lower Bilateral        12/21/24 1752               [name] 12/21/24 2003    Lenor Hollering, MD 12/21/24 2116  "

## 2024-12-21 NOTE — ED Triage Notes (Signed)
 L shoulder pain for 5 days. States is unable to lift shoulder due to pain.   Also reports BIL inner thigh pain. Concerned for  carotic artery blockage in leg states she has intermittent spasms

## 2024-12-27 ENCOUNTER — Ambulatory Visit (HOSPITAL_BASED_OUTPATIENT_CLINIC_OR_DEPARTMENT_OTHER): Payer: MEDICAID
